# Patient Record
Sex: Female | Born: 1985 | Race: White | Hispanic: No | Smoking: Current some day smoker
Health system: Southern US, Community
[De-identification: ages and names within clinical notes are randomized; demographics above are authoritative.]

## PROBLEM LIST (undated history)

## (undated) ENCOUNTER — Inpatient Hospital Stay: Payer: Self-pay

## (undated) ENCOUNTER — Inpatient Hospital Stay: Payer: Self-pay | Admitting: Obstetrics and Gynecology

## (undated) DIAGNOSIS — N83209 Unspecified ovarian cyst, unspecified side: Secondary | ICD-10-CM

## (undated) DIAGNOSIS — Z8751 Personal history of pre-term labor: Secondary | ICD-10-CM

## (undated) DIAGNOSIS — Z9289 Personal history of other medical treatment: Secondary | ICD-10-CM

## (undated) DIAGNOSIS — R160 Hepatomegaly, not elsewhere classified: Secondary | ICD-10-CM

## (undated) HISTORY — PX: CHOLECYSTECTOMY: SHX55

## (undated) HISTORY — PX: APPENDECTOMY: SHX54

## (undated) HISTORY — PX: NEPHRECTOMY: SHX65

---

## 2015-03-21 NOTE — L&D Delivery Note (Signed)
Obstetrical Delivery Note   Date of Delivery:   03/07/2016 Primary OB:   [facility] Gestational Age/EDD: [id number] (Dated by LMP) Antepartum complications: late entry to care, insufficient prenatal care, THC use during pregnancy  Delivered By:   Farrel ConnersGUTIERREZ, Marissa Weaver, CNM  Delivery Type:   spontaneous vaginal delivery  Procedure Details:   Called to see patient who was feeling pelvic pressure and was 9 cm. Dilated. AROM for moderte meconium stained amniotic fluid. Patient prepared for delivery and in one contraction delivered a vigorous female infant in OA with loose nuchal cord x 2, reduced on perineum. Baby dried and placed on mother's abdomen. After delayed cord clamping the cord was cut by a family friend and baby was taken to warmer and weighed per patient request. Placenta delivered intact spontaneously with 3 vessel cord. No lacerations requiring repair were seen.  Anesthesia:    epidural Intrapartum complications: Moderate meconium stained amniotic fluid GBS:    unknown Laceration:    none Episiotomy:    none Placenta:    Via active 3rd stage. To pathology: no Estimated Blood Loss:  * No surgery found *  Baby:    Liveborn female, Apgars 8/9, weight 6#2oz/ Margarita GrizzleBrantley    Almond Fitzgibbon, CNM

## 2015-07-28 ENCOUNTER — Emergency Department
Admission: EM | Admit: 2015-07-28 | Discharge: 2015-07-28 | Disposition: A | Discharge status: Home / Self Care | Payer: Self-pay | Attending: Emergency Medicine | Admitting: Emergency Medicine

## 2015-07-28 ENCOUNTER — Emergency Department: Payer: Self-pay

## 2015-07-28 ENCOUNTER — Encounter: Payer: Self-pay | Admitting: *Deleted

## 2015-07-28 DIAGNOSIS — Z3201 Encounter for pregnancy test, result positive: Secondary | ICD-10-CM | POA: Insufficient documentation

## 2015-07-28 DIAGNOSIS — O2341 Unspecified infection of urinary tract in pregnancy, first trimester: Secondary | ICD-10-CM | POA: Insufficient documentation

## 2015-07-28 DIAGNOSIS — Z3491 Encounter for supervision of normal pregnancy, unspecified, first trimester: Secondary | ICD-10-CM

## 2015-07-28 DIAGNOSIS — R109 Unspecified abdominal pain: Secondary | ICD-10-CM

## 2015-07-28 DIAGNOSIS — F17200 Nicotine dependence, unspecified, uncomplicated: Secondary | ICD-10-CM | POA: Insufficient documentation

## 2015-07-28 DIAGNOSIS — N390 Urinary tract infection, site not specified: Secondary | ICD-10-CM

## 2015-07-28 DIAGNOSIS — Z3A09 9 weeks gestation of pregnancy: Secondary | ICD-10-CM | POA: Insufficient documentation

## 2015-07-28 DIAGNOSIS — O26899 Other specified pregnancy related conditions, unspecified trimester: Secondary | ICD-10-CM

## 2015-07-28 DIAGNOSIS — A5901 Trichomonal vulvovaginitis: Secondary | ICD-10-CM | POA: Insufficient documentation

## 2015-07-28 DIAGNOSIS — O98311 Other infections with a predominantly sexual mode of transmission complicating pregnancy, first trimester: Secondary | ICD-10-CM | POA: Insufficient documentation

## 2015-07-28 LAB — URINALYSIS COMPLETE WITH MICROSCOPIC (ARMC ONLY)
BILIRUBIN URINE: NEGATIVE
GLUCOSE, UA: NEGATIVE mg/dL
HGB URINE DIPSTICK: NEGATIVE
Ketones, ur: NEGATIVE mg/dL
NITRITE: NEGATIVE
Protein, ur: NEGATIVE mg/dL
SPECIFIC GRAVITY, URINE: 1.009 (ref 1.005–1.030)
pH: 6 (ref 5.0–8.0)

## 2015-07-28 LAB — CBC
HCT: 37.1 % (ref 35.0–47.0)
Hemoglobin: 12.8 g/dL (ref 12.0–16.0)
MCH: 31.7 pg (ref 26.0–34.0)
MCHC: 34.5 g/dL (ref 32.0–36.0)
MCV: 91.8 fL (ref 80.0–100.0)
PLATELETS: 224 10*3/uL (ref 150–440)
RBC: 4.04 MIL/uL (ref 3.80–5.20)
RDW: 12.5 % (ref 11.5–14.5)
WBC: 9.9 10*3/uL (ref 3.6–11.0)

## 2015-07-28 LAB — BASIC METABOLIC PANEL
Anion gap: 6 (ref 5–15)
BUN: 7 mg/dL (ref 6–20)
CALCIUM: 9.1 mg/dL (ref 8.9–10.3)
CHLORIDE: 106 mmol/L (ref 101–111)
CO2: 25 mmol/L (ref 22–32)
CREATININE: 0.75 mg/dL (ref 0.44–1.00)
GFR calc Af Amer: >60 mL/min (ref >60)
GFR calc non Af Amer: >60 mL/min (ref >60)
GLUCOSE: 89 mg/dL (ref 65–99)
Potassium: 3.2 mmol/L — ABNORMAL LOW (ref 3.5–5.1)
Sodium: 137 mmol/L (ref 135–145)

## 2015-07-28 LAB — CHLAMYDIA/NGC RT PCR (ARMC ONLY)
CHLAMYDIA TR: NOT DETECTED
N gonorrhoeae: NOT DETECTED

## 2015-07-28 LAB — HCG, QUANTITATIVE, PREGNANCY: hCG, Beta Chain, Quant, S: 132268 m[IU]/mL — ABNORMAL HIGH (ref <5)

## 2015-07-28 LAB — WET PREP, GENITAL
Sperm: NONE SEEN
Yeast Wet Prep HPF POC: NONE SEEN

## 2015-07-28 LAB — POCT PREGNANCY, URINE: Preg Test, Ur: POSITIVE — AB

## 2015-07-28 MED ORDER — NITROFURANTOIN MONOHYD MACRO 100 MG PO CAPS: 100.0000 mg | ORAL_CAPSULE | Freq: Two times a day (BID) | ORAL | Status: DC

## 2015-07-28 MED ORDER — OXYCODONE-ACETAMINOPHEN 5-325 MG PO TABS
2.0000 | ORAL_TABLET | Freq: Once | ORAL | Status: AC
  Administered 2015-07-28: 2 via ORAL

## 2015-07-28 MED ORDER — METRONIDAZOLE 500 MG PO TABS: 500.0000 mg | ORAL_TABLET | Freq: Two times a day (BID) | ORAL | Status: DC

## 2015-07-28 MED ORDER — OXYCODONE-ACETAMINOPHEN 5-325 MG PO TABS
ORAL_TABLET | ORAL | Status: AC
  Filled 2015-07-28: qty 2

## 2015-07-28 MED ORDER — ONDANSETRON HCL 4 MG/2ML IJ SOLN
4.0000 mg | Freq: Once | INTRAMUSCULAR | Status: AC
  Administered 2015-07-28: 4 mg via INTRAVENOUS

## 2015-07-28 MED ORDER — ONDANSETRON HCL 4 MG/2ML IJ SOLN
INTRAMUSCULAR | Status: AC
  Filled 2015-07-28: qty 2

## 2015-07-28 MED ORDER — OXYCODONE-ACETAMINOPHEN 5-325 MG PO TABS: 2.0000 | ORAL_TABLET | Freq: Once | ORAL | Status: DC

## 2015-07-28 MED ORDER — OXYCODONE-ACETAMINOPHEN 5-325 MG PO TABS
1.0000 | ORAL_TABLET | Freq: Once | ORAL | Status: AC
  Administered 2015-07-28: 1 via ORAL
  Filled 2015-07-28: qty 1

## 2015-07-28 NOTE — ED Notes (Signed)
Pt reports she has right side flank pain, left upper toothache.  Sx for 3 weeks.  Pt reports she has nausea.  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  Pt alert.

## 2015-07-28 NOTE — ED Provider Notes (Signed)
[facility] Provider [facility]        Time seen: ----------------------------------------- 4:30 PM on 07/28/2015 -----------------------------------------    I have reviewed the triage vital signs and the nursing notes.   HISTORY  Chief Complaint Flank Pain and Dental Pain    HPI Kim Kidd is a 30 y.o. female who presents ER with right side flank pain and left upper tooth ache for the last 3 weeks.Patient is unsure if she is pregnant, she has had some nausea. 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 She denies fevers or chills or other complaints.   No past medical history on file.  There are no active problems to display for this patient.   No past surgical history on file.  Allergies Review of patient's allergies indicates no known allergies.  Social History Social History  Substance Use Topics  . Smoking status: Current Some Day Smoker  . Smokeless tobacco: Not on file  . Alcohol Use: No    Review of Systems Constitutional: Negative for fever. Eyes: Negative for visual changes. ENT: Negative for sore throat. Cardiovascular: Negative for chest pain. Respiratory: Negative for shortness of breath. Gastrointestinal: Positive for abdominal pain, nausea Genitourinary: Negative for dysuria. Musculoskeletal: Positive for right-sided low back pain Skin: Negative for rash. Neurological: Negative for headaches, focal weakness or numbness.  10-point ROS otherwise negative.  ____________________________________________   PHYSICAL EXAM:  VITAL SIGNS: ED Triage Vitals  Enc Vitals Group     BP 07/28/15 1549 125/73 mmHg     Pulse Rate 07/28/15 1549 96     Resp 07/28/15 1549 20     Temp 07/28/15 1549 98.5 F (36.9 C)     Temp Source 07/28/15 1549 Oral     SpO2 07/28/15 1549 99 %     Weight 07/28/15 1549 170 lb (77.111 kg)     Height  07/28/15 1549 5\' 7"  (1.702 m)     Head Cir --      Peak Flow --      Pain Score 07/28/15 1550 7     Pain Loc --      Pain Edu? --      Excl. in GC? --     Constitutional: Alert and oriented. Mild distress Eyes: Conjunctivae are normal. PERRL. Normal extraocular movements. ENT   Head: Normocephalic and atraumatic.   Nose: No congestion/rhinnorhea.   Mouth/Throat: Mucous membranes are moist.   Neck: No stridor. Cardiovascular: Normal rate, regular rhythm. No murmurs, rubs, or gallops. Respiratory: Normal respiratory effort without tachypnea nor retractions. Breath sounds are clear and equal bilaterally. No wheezes/rales/rhonchi. Gastrointestinal: Right pelvic and flank tenderness, no rebound or guarding. Normal bowel sounds. Right CVA tenderness. Genitourinary: Copious yellowish discharge Musculoskeletal: Nontender with normal range of motion in all extremities. No lower extremity tenderness nor edema. Neurologic:  Normal speech and language. No gross focal neurologic deficits are appreciated.  Skin:  Skin is warm, dry and intact. No rash noted. Psychiatric: Mood and affect are normal. Speech and behavior are normal.   ____________________________________________  ED COURSE:  Pertinent labs & imaging results that were available during my care of the patient were reviewed by me and considered in my medical decision making (see chart for details). Patient presents with nonspecific right-sided abdominal pain. I'll check basic labs and reevaluate. ____________________________________________    LABS (pertinent positives/negatives)  Labs Reviewed  WET PREP, GENITAL - Abnormal; Notable  for the following:    Trich, Wet Prep PRESENT (*)    Clue Cells Wet Prep HPF POC PRESENT (*)    WBC, Wet Prep HPF POC FEW (*)    All other components within normal limits  BASIC METABOLIC PANEL - Abnormal; Notable for the following:    Potassium 3.2 (*)    All other components within  normal limits  URINALYSIS COMPLETEWITH MICROSCOPIC ([facility] ONLY) - Abnormal; Notable for the following:    Color, Urine YELLOW (*)    APPearance HAZY (*)    Leukocytes, UA 3+ (*)    Bacteria, UA RARE (*)    Squamous Epithelial / LPF 6-30 (*)    All other components within normal limits  HCG, QUANTITATIVE, PREGNANCY - Abnormal; Notable for the following:    hCG, Beta Chain, Quant, S [id number] (*)    All other components within normal limits  POCT PREGNANCY, URINE - Abnormal; Notable for the following:    Preg Test, Ur POSITIVE (*)    All other components within normal limits  CHLAMYDIA/NGC RT PCR ([facility] ONLY)  CBC  POC URINE PREG, ED    RADIOLOGY  Pelvic ultrasound, Renal ultrasound  IMPRESSION: Single live intrauterine gestation with estimated gestational age [redacted] weeks 0 days by crown-rump length. IMPRESSION: No hydronephrosis.  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  Given the small size, consider follow-up imaging after pregnancy if clinically warranted. Renal ultrasound could be performed to document stability, or CT/MRI abdomen with/without contrast could be performed to attempt definitive characterization. ____________________________________________  FINAL ASSESSMENT AND PLAN  Abdominal pain and pregnancy, Trichomonas, UTI  Plan: Patient with labs and imaging as dictated above. Patient presents with a newly diagnosed pregnancy of 9 weeks. She also has UTI and Trichomonas. She'll be discharged with Macrobid and Flagyl. She is referred to OB/GYN for outpatient follow-up.   Emily Filbert, MD   Note: This dictation was prepared with Dragon dictation. Any transcriptional errors that result from this process are unintentional   Emily Filbert, MD 07/28/15 [phone]

## 2015-07-28 NOTE — Discharge Instructions (Signed)
First Trimester of Pregnancy °The first trimester of pregnancy is from week 1 until the end of week 12 (months 1 through 3). During this time, your baby will begin to develop inside you. At 6-8 weeks, the eyes and face are formed, and the heartbeat can be seen on ultrasound. At the end of 12 weeks, all the baby's organs are formed. Prenatal care is all the medical care you receive before the birth of your baby. Make sure you get good prenatal care and follow all of your doctor's instructions. °HOME CARE  °Medicines °· Take medicine only as told by your doctor. Some medicines are safe and some are not during pregnancy. °· Take your prenatal vitamins as told by your doctor. °· Take medicine that helps you poop (stool softener) as needed if your doctor says it is okay. °Diet °· Eat regular, healthy meals. °· Your doctor will tell you the amount of weight gain that is right for you. °· Avoid raw meat and uncooked cheese. °· If you feel sick to your stomach (nauseous) or throw up (vomit): °· Eat 4 or 5 small meals a day instead of 3 large meals. °· Try eating a few soda crackers. °· Drink liquids between meals instead of during meals. °· If you have a hard time pooping (constipation): °· Eat high-fiber foods like fresh vegetables, fruit, and whole grains. °· Drink enough fluids to keep your pee (urine) clear or pale yellow. °Activity and Exercise °· Exercise only as told by your doctor. Stop exercising if you have cramps or pain in your lower belly (abdomen) or low back. °· Try to avoid standing for long periods of time. Move your legs often if you must stand in one place for a long time. °· Avoid heavy lifting. °· Wear low-heeled shoes. Sit and stand up straight. °· You can have sex unless your doctor tells you not to. °Relief of Pain or Discomfort °· Wear a good support bra if your breasts are sore. °· Take warm water baths (sitz baths) to soothe pain or discomfort caused by hemorrhoids. Use hemorrhoid cream if your  doctor says it is okay. °· Rest with your legs raised if you have leg cramps or low back pain. °· Wear support hose if you have puffy, bulging veins (varicose veins) in your legs. Raise (elevate) your feet for 15 minutes, 3-4 times a day. Limit salt in your diet. °Prenatal Care °· Schedule your prenatal visits by the twelfth week of pregnancy. °· Write down your questions. Take them to your prenatal visits. °· Keep all your prenatal visits as told by your doctor. °Safety °· Wear your seat belt at all times when driving. °· Make a list of emergency phone numbers. The list should include numbers for family, friends, the hospital, and police and fire departments. °General Tips °· Ask your doctor for a referral to a local prenatal class. Begin classes no later than at the start of month 6 of your pregnancy. °· Ask for help if you need counseling or help with nutrition. Your doctor can give you advice or tell you where to go for help. °· Do not use hot tubs, steam rooms, or saunas. °· Do not douche or use tampons or scented sanitary pads. °· Do not cross your legs for long periods of time. °· Avoid litter boxes and soil used by cats. °· Avoid all smoking, herbs, and alcohol. Avoid drugs not approved by your doctor. °· Do not use any tobacco products, including cigarettes,   chewing tobacco, and electronic cigarettes. If you need help quitting, ask your doctor. You may get counseling or other support to help you quit.  Visit your dentist. At home, brush your teeth with a soft toothbrush. Be gentle when you floss. GET HELP IF:  You are dizzy.  You have mild cramps or pressure in your lower belly.  You have a nagging pain in your belly area.  You continue to feel sick to your stomach, throw up, or have watery poop (diarrhea).  You have a bad smelling fluid coming from your vagina.  You have pain with peeing (urination).  You have increased puffiness (swelling) in your face, hands, legs, or ankles. GET HELP  RIGHT AWAY IF:   You have a fever.  You are leaking fluid from your vagina.  You have spotting or bleeding from your vagina.  You have very bad belly cramping or pain.  You gain or lose weight rapidly.  You throw up blood. It may look like coffee grounds.  You are around people who have [place] measles, fifth disease, or chickenpox.  You have a very bad headache.  You have shortness of breath.  You have any kind of trauma, such as from a fall or a car accident.   This information is not intended to replace advice given to you by your health care provider. Make sure you discuss any questions you have with your health care provider.   Document Released: 08/23/2007 Document Revised: 03/27/2014 Document Reviewed: 01/14/2013 Elsevier Interactive Patient Education 2016 Elsevier Inc.  Pregnancy and Urinary Tract Infection A urinary tract infection (UTI) is a bacterial infection of the urinary tract. Infection of the urinary tract can include the ureters, kidneys (pyelonephritis), bladder (cystitis), and urethra (urethritis). All pregnant women should be screened for bacteria in the urinary tract. Identifying and treating a UTI will decrease the risk of preterm labor and developing more serious infections in both the mother and baby. CAUSES Bacteria germs cause almost all UTIs.  RISK FACTORS Many factors can increase your chances of getting a UTI during pregnancy. These include:  Having a short urethra.  Poor toilet and hygiene habits.  Sexual intercourse.  Blockage of urine along the urinary tract.  Problems with the pelvic muscles or nerves.  Diabetes.  Obesity.  Bladder problems after having several children.  Previous history of UTI. SIGNS AND SYMPTOMS   Pain, burning, or a stinging feeling when urinating.  Suddenly feeling the need to urinate right away (urgency).  Loss of bladder control (urinary incontinence).  Frequent urination, more than is common with  pregnancy.  Lower abdominal or back discomfort.  Cloudy urine.  Blood in the urine (hematuria).  Fever. When the kidneys are infected, the symptoms may be:  Back pain.  Flank pain on the right side more so than the left.  Fever.  Chills.  Nausea.  Vomiting. DIAGNOSIS  A urinary tract infection is usually diagnosed through urine tests. Additional tests and procedures are sometimes done. These may include:  Ultrasound exam of the kidneys, ureters, bladder, and urethra.  Looking in the bladder with a lighted tube (cystoscopy). TREATMENT Typically, UTIs can be treated with antibiotic medicines.  HOME CARE INSTRUCTIONS   Only take over-the-counter or prescription medicines as directed by your health care provider. If you were prescribed antibiotics, take them as directed. Finish them even if you start to feel better.  Drink enough fluids to keep your urine clear or pale yellow.  Do not have sexual intercourse until  the infection is gone and your health care provider says it is okay.  Make sure you are tested for UTIs throughout your pregnancy. These infections often come back. Preventing a UTI in the Future  Practice good toilet habits. Always wipe from front to back. Use the tissue only once.  Do not hold your urine. Empty your bladder as soon as possible when the urge comes.  Do not douche or use deodorant sprays.  Wash with soap and warm water around the genital area and the anus.  Empty your bladder before and after sexual intercourse.  Wear underwear with a cotton crotch.  Avoid caffeine and carbonated drinks. They can irritate the bladder.  Drink cranberry juice or take cranberry pills. This may decrease the risk of getting a UTI.  Do not drink alcohol.  Keep all your appointments and tests as scheduled. SEEK MEDICAL CARE IF:   Your symptoms get worse.  You are still having fevers 2 or more days after treatment begins.  You have a rash.  You  feel that you are having problems with medicines prescribed.  You have abnormal vaginal discharge. SEEK IMMEDIATE MEDICAL CARE IF:   You have back or flank pain.  You have chills.  You have blood in your urine.  You have nausea and vomiting.  You have contractions of your uterus.  You have a gush of fluid from the vagina. MAKE SURE YOU:  Understand these instructions.   Will watch your condition.   Will get help right away if you are not doing well or get worse.    This information is not intended to replace advice given to you by your health care provider. Make sure you discuss any questions you have with your health care provider.   Document Released: 07/01/2010 Document Revised: 12/25/2012 Document Reviewed: 10/03/2012 Elsevier Interactive Patient Education 2016 [employer]  Trichomoniasis Trichomoniasis is an infection caused by an organism called Trichomonas. The infection can affect both women and men. In women, the outer female genitalia and the vagina are affected. In men, the penis is mainly affected, but the prostate and other reproductive organs can also be involved. Trichomoniasis is a sexually transmitted infection (STI) and is most often passed to another person through sexual contact.  RISK FACTORS  Having unprotected sexual intercourse.  Having sexual intercourse with an infected partner. SIGNS AND SYMPTOMS  Symptoms of trichomoniasis in women include:  Abnormal gray-green frothy vaginal discharge.  Itching and irritation of the vagina.  Itching and irritation of the area outside the vagina. Symptoms of trichomoniasis in men include:   Penile discharge with or without pain.  Pain during urination. This results from inflammation of the urethra. DIAGNOSIS  Trichomoniasis may be found during a Pap test or physical exam. Your health care provider may use one of the following methods to help diagnose this infection:  Testing the pH of the vagina  with a test tape.  Using a vaginal swab test that checks for the Trichomonas organism. A test is available that provides results within a few minutes.  Examining a urine sample.  Testing vaginal secretions. Your health care provider may test you for other STIs, including HIV. TREATMENT   You may be given medicine to fight the infection. Women should inform their health care provider if they could be or are pregnant. Some medicines used to treat the infection should not be taken during pregnancy.  Your health care provider may recommend over-the-counter medicines or creams to decrease itching or  irritation.  Your sexual partner will need to be treated if infected.  Your health care provider may test you for infection again 3 months after treatment. HOME CARE INSTRUCTIONS   Take medicines only as directed by your health care provider.  Take over-the-counter medicine for itching or irritation as directed by your health care provider.  Do not have sexual intercourse while you have the infection.  Women should not douche or wear tampons while they have the infection.  Discuss your infection with your partner. Your partner may have gotten the infection from you, or you may have gotten it from your partner.  Have your sex partner get examined and treated if necessary.  Practice safe, informed, and protected sex.  See your health care provider for other STI testing. SEEK MEDICAL CARE IF:   You still have symptoms after you finish your medicine.  You develop abdominal pain.  You have pain when you urinate.  You have bleeding after sexual intercourse.  You develop a rash.  Your medicine makes you sick or makes you throw up (vomit). MAKE SURE YOU:  Understand these instructions.  Will watch your condition.  Will get help right away if you are not doing well or get worse.   This information is not intended to replace advice given to you by your health care provider. Make  sure you discuss any questions you have with your health care provider.   Document Released: 08/30/2000 Document Revised: 03/27/2014 Document Reviewed: 12/16/2012 Elsevier Interactive Patient Education [employer]

## 2015-09-16 ENCOUNTER — Emergency Department
Admission: EM | Admit: 2015-09-16 | Discharge: 2015-09-16 | Disposition: A | Discharge status: Home / Self Care | Payer: Self-pay | Attending: Emergency Medicine | Admitting: Emergency Medicine

## 2015-09-16 ENCOUNTER — Encounter: Payer: Self-pay | Admitting: Emergency Medicine

## 2015-09-16 DIAGNOSIS — O23511 Infections of cervix in pregnancy, first trimester: Secondary | ICD-10-CM | POA: Insufficient documentation

## 2015-09-16 DIAGNOSIS — O99331 Smoking (tobacco) complicating pregnancy, first trimester: Secondary | ICD-10-CM | POA: Insufficient documentation

## 2015-09-16 DIAGNOSIS — N390 Urinary tract infection, site not specified: Secondary | ICD-10-CM

## 2015-09-16 DIAGNOSIS — O26891 Other specified pregnancy related conditions, first trimester: Secondary | ICD-10-CM | POA: Insufficient documentation

## 2015-09-16 DIAGNOSIS — R109 Unspecified abdominal pain: Secondary | ICD-10-CM

## 2015-09-16 DIAGNOSIS — A599 Trichomoniasis, unspecified: Secondary | ICD-10-CM | POA: Insufficient documentation

## 2015-09-16 DIAGNOSIS — N72 Inflammatory disease of cervix uteri: Secondary | ICD-10-CM

## 2015-09-16 DIAGNOSIS — Z3A13 13 weeks gestation of pregnancy: Secondary | ICD-10-CM | POA: Insufficient documentation

## 2015-09-16 DIAGNOSIS — O26899 Other specified pregnancy related conditions, unspecified trimester: Secondary | ICD-10-CM

## 2015-09-16 DIAGNOSIS — M5431 Sciatica, right side: Secondary | ICD-10-CM

## 2015-09-16 DIAGNOSIS — R1032 Left lower quadrant pain: Secondary | ICD-10-CM | POA: Insufficient documentation

## 2015-09-16 DIAGNOSIS — F17210 Nicotine dependence, cigarettes, uncomplicated: Secondary | ICD-10-CM | POA: Insufficient documentation

## 2015-09-16 DIAGNOSIS — O2341 Unspecified infection of urinary tract in pregnancy, first trimester: Secondary | ICD-10-CM | POA: Insufficient documentation

## 2015-09-16 DIAGNOSIS — M5441 Lumbago with sciatica, right side: Secondary | ICD-10-CM | POA: Insufficient documentation

## 2015-09-16 LAB — CBC
HCT: 36 % (ref 35.0–47.0)
Hemoglobin: 12.7 g/dL (ref 12.0–16.0)
MCH: 32.5 pg (ref 26.0–34.0)
MCHC: 35.3 g/dL (ref 32.0–36.0)
MCV: 91.9 fL (ref 80.0–100.0)
PLATELETS: 252 10*3/uL (ref 150–440)
RBC: 3.92 MIL/uL (ref 3.80–5.20)
RDW: 12.7 % (ref 11.5–14.5)
WBC: 10.9 10*3/uL (ref 3.6–11.0)

## 2015-09-16 LAB — WET PREP, GENITAL
Clue Cells Wet Prep HPF POC: NONE SEEN
Sperm: NONE SEEN
Yeast Wet Prep HPF POC: NONE SEEN

## 2015-09-16 LAB — CHLAMYDIA/NGC RT PCR (ARMC ONLY)
Chlamydia Tr: NOT DETECTED
N GONORRHOEAE: NOT DETECTED

## 2015-09-16 LAB — URINALYSIS COMPLETE WITH MICROSCOPIC (ARMC ONLY)
BILIRUBIN URINE: NEGATIVE
GLUCOSE, UA: NEGATIVE mg/dL
Hgb urine dipstick: NEGATIVE
NITRITE: NEGATIVE
Protein, ur: NEGATIVE mg/dL
SPECIFIC GRAVITY, URINE: 1.026 (ref 1.005–1.030)
pH: 5 (ref 5.0–8.0)

## 2015-09-16 LAB — COMPREHENSIVE METABOLIC PANEL
ALT: 9 U/L — AB (ref 14–54)
AST: 12 U/L — AB (ref 15–41)
Albumin: 3.6 g/dL (ref 3.5–5.0)
Alkaline Phosphatase: 47 U/L (ref 38–126)
Anion gap: 8 (ref 5–15)
BILIRUBIN TOTAL: 0.4 mg/dL (ref 0.3–1.2)
BUN: 8 mg/dL (ref 6–20)
CALCIUM: 8.9 mg/dL (ref 8.9–10.3)
CO2: 24 mmol/L (ref 22–32)
CREATININE: 0.62 mg/dL (ref 0.44–1.00)
Chloride: 102 mmol/L (ref 101–111)
Glucose, Bld: 111 mg/dL — ABNORMAL HIGH (ref 65–99)
Potassium: 3.2 mmol/L — ABNORMAL LOW (ref 3.5–5.1)
Sodium: 134 mmol/L — ABNORMAL LOW (ref 135–145)
TOTAL PROTEIN: 7.5 g/dL (ref 6.5–8.1)

## 2015-09-16 LAB — LIPASE, BLOOD: Lipase: 25 U/L (ref 11–51)

## 2015-09-16 LAB — POCT PREGNANCY, URINE: PREG TEST UR: POSITIVE — AB

## 2015-09-16 MED ORDER — AZITHROMYCIN 1 G PO PACK
PACK | ORAL | Status: AC
  Administered 2015-09-16: 1 g via ORAL
  Filled 2015-09-16: qty 1

## 2015-09-16 MED ORDER — CEFTRIAXONE SODIUM 250 MG IJ SOLR
250.0000 mg | Freq: Once | INTRAMUSCULAR | Status: AC
  Administered 2015-09-16: 250 mg via INTRAMUSCULAR

## 2015-09-16 MED ORDER — ACETAMINOPHEN 325 MG PO TABS
650.0000 mg | ORAL_TABLET | Freq: Once | ORAL | Status: AC
  Administered 2015-09-16: 650 mg via ORAL

## 2015-09-16 MED ORDER — METRONIDAZOLE 500 MG PO TABS
500.0000 mg | ORAL_TABLET | Freq: Once | ORAL | Status: AC
  Administered 2015-09-16: 500 mg via ORAL

## 2015-09-16 MED ORDER — CEPHALEXIN 500 MG PO CAPS
500.0000 mg | ORAL_CAPSULE | Freq: Once | ORAL | Status: AC
  Administered 2015-09-16: 500 mg via ORAL
  Filled 2015-09-16 (×2): qty 1

## 2015-09-16 MED ORDER — METOCLOPRAMIDE HCL 10 MG PO TABS
10.0000 mg | ORAL_TABLET | Freq: Once | ORAL | Status: AC
  Administered 2015-09-16: 10 mg via ORAL
  Filled 2015-09-16 (×2): qty 1

## 2015-09-16 MED ORDER — CEFTRIAXONE SODIUM 250 MG IJ SOLR
INTRAMUSCULAR | Status: AC
  Administered 2015-09-16: 250 mg via INTRAMUSCULAR
  Filled 2015-09-16: qty 250

## 2015-09-16 MED ORDER — LIDOCAINE HCL (PF) 1 % IJ SOLN
INTRAMUSCULAR | Status: AC
  Filled 2015-09-16: qty 5

## 2015-09-16 MED ORDER — METOCLOPRAMIDE HCL 10 MG PO TABS: 10.0000 mg | ORAL_TABLET | Freq: Four times a day (QID) | ORAL | Status: DC | PRN

## 2015-09-16 MED ORDER — METRONIDAZOLE 500 MG PO TABS: 500.0000 mg | ORAL_TABLET | Freq: Two times a day (BID) | ORAL | Status: AC

## 2015-09-16 MED ORDER — AZITHROMYCIN 1 G PO PACK
1.0000 g | PACK | Freq: Once | ORAL | Status: AC
  Administered 2015-09-16: 1 g via ORAL

## 2015-09-16 MED ORDER — CEPHALEXIN 500 MG PO CAPS: 500.0000 mg | ORAL_CAPSULE | Freq: Three times a day (TID) | ORAL | Status: AC

## 2015-09-16 MED ORDER — ACETAMINOPHEN 325 MG PO TABS
ORAL_TABLET | ORAL | Status: AC
  Administered 2015-09-16: 650 mg via ORAL
  Filled 2015-09-16: qty 2

## 2015-09-16 MED ORDER — METRONIDAZOLE 500 MG PO TABS
ORAL_TABLET | ORAL | Status: AC
  Administered 2015-09-16: 500 mg via ORAL
  Filled 2015-09-16: qty 1

## 2015-09-16 NOTE — ED Notes (Signed)
Pt in via triage with complaints of LUQ abdominal pain with nausea/vomiting x 2 days.  Pt also with complaints of right lower back pain radiating down into leg x 2 weeks.  Pt A/Ox4, no immediate distress at this time.

## 2015-09-16 NOTE — ED Provider Notes (Addendum)
[facility] Emergency Department Provider Note   ____________________________________________  Time seen: Approximately 8:10 PM  I have reviewed the triage vital signs and the nursing notes.   HISTORY  Chief Complaint Back Pain   HPI Kim Kidd is a 30 y.o. female with a history of a cholecystectomy as well as an appendectomy was presented to the emergency department today at [redacted] weeks pregnant. She is taking prenatal vitamins at home but has not established prenatal care area. She had an ultrasound with intrauterine pregnancy done in May here. She is here complaining of right-sided back pain. It is the low lumbar region radiating to her buttock. She says it is sharp and a 6 out of 10. She says that she is also vomiting over the past 3 days and having some left sided abdominal cramping. She says that she is also having a clear, sticky vaginal discharge. She says that she was recently treated for Trichomonas when she was here in May.Denies any loss of bowel or bladder continence.   History reviewed. No pertinent past medical history.  There are no active problems to display for this patient.   Past Surgical History  Procedure Laterality Date  . Nephrectomy    . Cholecystectomy    . Appendectomy      Current Outpatient Rx  Name  Route  Sig  Dispense  Refill  . metroNIDAZOLE (FLAGYL) 500 MG tablet   Oral   Take 1 tablet (500 mg total) by mouth 2 (two) times daily.   14 tablet   0   . nitrofurantoin, macrocrystal-monohydrate, (MACROBID) 100 MG capsule   Oral   Take 1 capsule (100 mg total) by mouth 2 (two) times daily.   20 capsule   0     Allergies Review of patient's allergies indicates no known allergies.  No family history on file.  Social History Social History  Substance Use Topics  . Smoking status: Current Some Day Smoker -- 0.50 packs/day    Types: Cigarettes  . Smokeless tobacco: None  . Alcohol Use: No    Review of  Systems Constitutional: No fever/chills Eyes: No visual changes. ENT: No sore throat. Cardiovascular: Denies chest pain. Respiratory: Denies shortness of breath. Gastrointestinal:  No diarrhea.  No constipation. Genitourinary: Negative for dysuria. Musculoskeletal: As above Skin: Negative for rash. Neurological: Negative for headaches, focal weakness or numbness.  10-point ROS otherwise negative.  ____________________________________________   PHYSICAL EXAM:  VITAL SIGNS: ED Triage Vitals  Enc Vitals Group     BP 09/16/15 1833 126/70 mmHg     Pulse Rate 09/16/15 1833 101     Resp 09/16/15 1833 16     Temp 09/16/15 1833 98.6 F (37 C)     Temp Source 09/16/15 1833 Oral     SpO2 09/16/15 1833 100 %     Weight 09/16/15 1833 180 lb (81.647 kg)     Height 09/16/15 1833  (1.702 m)     Head Cir --      Peak Flow --      Pain Score 09/16/15 1834 7     Pain Loc --      Pain Edu? --      Excl. in GC? --     Constitutional: Alert and oriented. Well appearing and in no acute distress. Eyes: Conjunctivae are normal. PERRL. EOMI. Head: Atraumatic. Nose: No congestion/rhinnorhea. Mouth/Throat: Mucous membranes are moist.   Neck: No stridor.   Cardiovascular: Normal rate, regular rhythm. Grossly normal  heart sounds.   Respiratory: Normal respiratory effort.  No retractions. Lungs CTAB. Gastrointestinal: Soft With gravid uterus about 2 cm above the pelvic brim. Tender to palpation suprapubic as well as to the left lower quadrant. Tenderness is mild. No distention.  No CVA tenderness. Genitourinary: Normal external exam. Speculum exam with a yellow discharge. Bimanual exam with a closed cervical os. There is no CMT. No uterine or adnexal tenderness nor masses. Musculoskeletal: No lower extremity tenderness nor edema.  No joint effusions. No tenderness to low back. Negative straight leg raises bilaterally. Neurologic:  Normal speech and language. No gross focal neurologic deficits  are appreciated.  Skin:  Skin is warm, dry and intact. No rash noted. Psychiatric: Mood and affect are normal. Speech and behavior are normal.  ____________________________________________   LABS (all labs ordered are listed, but only abnormal results are displayed)  Labs Reviewed  WET PREP, GENITAL - Abnormal; Notable for the following:    Trich, Wet Prep PRESENT (*)    WBC, Wet Prep HPF POC MANY (*)    All other components within normal limits  COMPREHENSIVE METABOLIC PANEL - Abnormal; Notable for the following:    Sodium 134 (*)    Potassium 3.2 (*)    Glucose, Bld 111 (*)    AST 12 (*)    ALT 9 (*)    All other components within normal limits  URINALYSIS COMPLETEWITH MICROSCOPIC ([facility] ONLY) - Abnormal; Notable for the following:    Color, Urine YELLOW (*)    APPearance HAZY (*)    Ketones, ur TRACE (*)    Leukocytes, UA 2+ (*)    Bacteria, UA FEW (*)    Squamous Epithelial / LPF 0-5 (*)    All other components within normal limits  POCT PREGNANCY, URINE - Abnormal; Notable for the following:    Preg Test, Ur POSITIVE (*)    All other components within normal limits  CHLAMYDIA/NGC RT PCR ([facility] ONLY)  LIPASE, BLOOD  CBC  POC URINE PREG, ED   ____________________________________________  EKG   ____________________________________________  RADIOLOGY   ____________________________________________   PROCEDURES    ____________________________________________   INITIAL IMPRESSION / ASSESSMENT AND PLAN / ED COURSE  Pertinent labs & imaging results that were available during my care of the patient were reviewed by me and considered in my medical decision making (see chart for details).  Fetal heart tones measured at 153 bpm.  ----------------------------------------- 9:22 PM on 09/16/2015 -----------------------------------------  Patient was positive for Trichomonas. She says that she has been sexually active with the same partner since being  diagnosed and treated the last time she is unsure if this partner has also been treated although she did tell him that he would need to be. Very reassuring pelvic exam as far as PID.  We'll treat for cervicitis as well as Trichomonas. We'll also treat for UTI. Feel that the right-sided back and buttock pain is likely sciatic pain which is likely worsening during pregnancy. I told patient that she must not take ibuprofen nor Aleve or any other product that is not Tylenol. She may continue to use her heating pad. She will continue to take her prenatal vitamins. She knows not to have any sexual activity until she has been treated and also tested and cleared by another health care provider for other sexually transmitted diseases such as HIV, syphilis and herpes. She'll be following up with OB/GYN for these tests. She is also aware that she missed her partner that she was diagnosed  again with Trichomonas and that he must be treated and also not engage in any sexual activity until he is treated and then cleared after further testing for other STDs such as HIV, syphilis and herpes. She is understanding of the plan and willing to comply. ____________________________________________   FINAL CLINICAL IMPRESSION(S) / ED DIAGNOSES  Sciatica. Abdominal pain and pregnancy. Cervicitis. Trichomonas. UTI.    NEW MEDICATIONS STARTED DURING THIS VISIT:  New Prescriptions   No medications on file     Note:  This document was prepared using Dragon voice recognition software and may include unintentional dictation errors.    Myrna Blazeravid Matthew Astaria Nanez, MD 09/16/15 2125  Also, patient able to tolerate by mouth in the emergency Department after Reglan. No episodes of vomiting in the emergency department.  Myrna Blazeravid Matthew Oniel Meleski, MD 09/16/15 2135

## 2015-09-16 NOTE — ED Notes (Signed)
C/O abdominal pain and low back pain x 2 weeks.  Patient is [redacted] weeks pregnant, has not had any prenatal care.

## 2015-09-18 LAB — URINE CULTURE

## 2015-12-10 ENCOUNTER — Other Ambulatory Visit: Payer: Self-pay | Admitting: Advanced Practice Midwife

## 2015-12-23 ENCOUNTER — Ambulatory Visit: Admission: RE | Admit: 2015-12-23 | Payer: Self-pay | Source: Ambulatory Visit

## 2016-01-21 ENCOUNTER — Encounter: Payer: Self-pay | Admitting: *Deleted

## 2016-01-21 ENCOUNTER — Observation Stay
Admission: EM | Admit: 2016-01-21 | Discharge: 2016-01-21 | Disposition: A | Discharge status: Home / Self Care | Payer: Medicaid Other | Attending: Obstetrics & Gynecology | Admitting: Obstetrics & Gynecology

## 2016-01-21 DIAGNOSIS — R1030 Lower abdominal pain, unspecified: Secondary | ICD-10-CM | POA: Diagnosis not present

## 2016-01-21 DIAGNOSIS — R197 Diarrhea, unspecified: Secondary | ICD-10-CM | POA: Diagnosis not present

## 2016-01-21 DIAGNOSIS — O26893 Other specified pregnancy related conditions, third trimester: Principal | ICD-10-CM | POA: Insufficient documentation

## 2016-01-21 DIAGNOSIS — Z3A34 34 weeks gestation of pregnancy: Secondary | ICD-10-CM | POA: Insufficient documentation

## 2016-01-21 DIAGNOSIS — Z9049 Acquired absence of other specified parts of digestive tract: Secondary | ICD-10-CM | POA: Insufficient documentation

## 2016-01-21 DIAGNOSIS — O99613 Diseases of the digestive system complicating pregnancy, third trimester: Secondary | ICD-10-CM | POA: Insufficient documentation

## 2016-01-21 DIAGNOSIS — O26899 Other specified pregnancy related conditions, unspecified trimester: Secondary | ICD-10-CM | POA: Diagnosis present

## 2016-01-21 DIAGNOSIS — R109 Unspecified abdominal pain: Secondary | ICD-10-CM

## 2016-01-21 MED ORDER — ACETAMINOPHEN 325 MG PO TABS: 650.0000 mg | ORAL_TABLET | ORAL | Status: DC | PRN

## 2016-01-21 MED ORDER — ONDANSETRON HCL 4 MG/2ML IJ SOLN: 4.0000 mg | Freq: Four times a day (QID) | INTRAMUSCULAR | Status: DC | PRN

## 2016-01-21 MED ORDER — HYDROCODONE-ACETAMINOPHEN 5-325 MG PO TABS
1.0000 | ORAL_TABLET | Freq: Once | ORAL | Status: AC
  Administered 2016-01-21: 1 via ORAL

## 2016-01-21 NOTE — Discharge Instructions (Signed)
Discharge instructions and labor precautions reviewed with patient; patient verbalized understanding.  Pt. Signed copy of instructions and copy given.  Pt. Encouraged  to continue with scheduled OB appointment.

## 2016-01-21 NOTE — H&P (Signed)
Obstetrics Admission History & Physical   CC: lower abdominal pain and diarrhea, pregnancy   HPI:  30 y.o. [id number] @ 7272w0d (03/03/2016, by Other Basis). Admitted on 01/21/2016:   Patient Active Problem List   Diagnosis Date Noted  . Abdominal pain affecting pregnancy 01/21/2016  . Diarrhea 01/21/2016     Presents for lower abdominal pain for 3 days, also diarrhea for 3 days (3 episodes today).  Has been on Immodium.  Good FM.  No VB or ROM.  No radiation.  No alleviators.  No other context.  [facility]  PMHx: History reviewed. No pertinent past medical history. PSHx:  Past Surgical History:  Procedure Laterality Date  . APPENDECTOMY    . CHOLECYSTECTOMY    . NEPHRECTOMY     Medications:  Prescriptions Prior to Admission  Medication Sig Dispense Refill Last Dose  . metoCLOPramide (REGLAN) 10 MG tablet Take 1 tablet (10 mg total) by mouth every 6 (six) hours as needed. (Patient not taking: Reported on 01/21/2016) 12 tablet 0 Not Taking at Unknown time  . nitrofurantoin, macrocrystal-monohydrate, (MACROBID) 100 MG capsule Take 1 capsule (100 mg total) by mouth 2 (two) times daily. (Patient not taking: Reported on 01/21/2016) 20 capsule 0 Completed Course at Unknown time   Allergies: has No Known Allergies. OBHx:  OB History  Gravida Para Term Preterm AB Living  6 5 3 2   4   SAB TAB Ectopic Multiple Live Births          4    # Outcome Date GA Lbr Len/2nd Weight Sex Delivery Anes PTL Lv  6 Current           5 Preterm 10/05/07    M Vag-Spont   LIV  4 Preterm 05/19/06    M Vag-Spont   FD     Complications: Abruptio Placenta  3 Term 10/07/05    M Vag-Spont   LIV  2 Term 05/23/04    F Vag-Spont   LIV  1 Term 10/02/02    M Vag-Spont   LIV     AVW:[id number]/[id number]:Negative/unremarkable except as detailed in HPI.  No GYN cancers. Soc Hx: Alcohol: none and Recreational drug use: none  Objective:   Vitals:   01/21/16 1303  BP: 118/68  Pulse: 85  Resp: 18  Temp: 97.3 F  (36.3 C)   General: Well nourished, well developed female in no acute distress.  Skin: Warm and dry.  Cardiovascular:Regular rate and rhythm. Respiratory: Clear to auscultation bilateral. Normal respiratory effort Abdomen: mild Neuro/Psych: Normal mood and affect.   Pelvic exam: is not limited by body habitus EGBUS: within normal limits Vagina: within normal limits and with normal mucosa, no blood in the vault Cervix: closed, soft, thick, high, Vtx Uterus: Uterus demonstrates irritability pattern.  Adnexa: not evaluated  EFM:FHR: 140 bpm, variability: moderate,  accelerations:  Present,  decelerations:  Absent Toco: None  Assessment & Plan:   30 y.o. [id number] @ 172w0d, Admitted on 01/21/2016: abdominal pain and diarrhea  Fluids, Rest, Immodium, Monitor for s/sx PTL (none now), Tylenol.

## 2016-01-21 NOTE — Progress Notes (Signed)
Pain medication given along with water, PO hydrating. Pt. Tolerating well.

## 2016-01-21 NOTE — Discharge Summary (Signed)
Physician Discharge Summary  Patient ID: Kim MiloKrystle Sandles MRN: [id number] DOB/AGE: [birth year] 30 y.o.  Admit date: 01/21/2016 Discharge date: 01/21/2016  Admission Diagnoses: Abdominal pain and diarrhea  Discharge Diagnoses:  Active Problems:   Abdominal pain affecting pregnancy   Diarrhea  Discharged Condition: good  Hospital Course: Monitored for s/sx PTL.  Treatment options discussed for diarrhea.  Consults: None  Significant Diagnostic Studies: A NST procedure was performed with FHR monitoring and a normal baseline established, appropriate time of 20-40 minutes of evaluation, and accels >2 seen w 15x15 characteristics.  Results show a REACTIVE NST.   Treatments: none  Discharge Exam: Blood pressure 118/68, pulse 85, temperature 97.3 F (36.3 C), temperature source Oral, resp. rate 18, last menstrual period 06/03/2015. No change  Disposition: 01-Home or Self Care     Medication List    TAKE these medications   metoCLOPramide 10 MG tablet Commonly known as:  REGLAN Take 1 tablet (10 mg total) by mouth every 6 (six) hours as needed.   nitrofurantoin (macrocrystal-monohydrate) 100 MG capsule Commonly known as:  MACROBID Take 1 capsule (100 mg total) by mouth 2 (two) times daily.      Follow-up Information    Letitia Libraobert Paul Kathreen Dileo, MD. Go in 1 week(s).   Specialty:  Obstetrics and Gynecology Contact information: [address] [place] [address] [phone]           Signed: Letitia LibraRobert Paul Rilee Wendling 01/21/2016, 3:16 PM

## 2016-01-21 NOTE — OB Triage Note (Signed)
Starting lower abd. cramping this past Tuesday; cramping is getting stronger.  "Also it feels like someone has hit me in the lower back." Pain 6/10. Denies sudden gush of fluid or vaginal bleeding. Positive for fetal movement. Last sexual encounter was a couple of months ago.

## 2016-03-05 ENCOUNTER — Observation Stay
Admission: EM | Admit: 2016-03-05 | Discharge: 2016-03-05 | Disposition: A | Discharge status: Home / Self Care | Payer: Medicaid Other | Attending: Obstetrics and Gynecology | Admitting: Obstetrics and Gynecology

## 2016-03-05 DIAGNOSIS — Z3A40 40 weeks gestation of pregnancy: Secondary | ICD-10-CM | POA: Insufficient documentation

## 2016-03-05 DIAGNOSIS — O471 False labor at or after 37 completed weeks of gestation: Secondary | ICD-10-CM | POA: Diagnosis present

## 2016-03-05 DIAGNOSIS — O480 Post-term pregnancy: Secondary | ICD-10-CM | POA: Insufficient documentation

## 2016-03-07 ENCOUNTER — Inpatient Hospital Stay
Admission: EM | Admit: 2016-03-07 | Discharge: 2016-03-09 | DRG: 775 | Disposition: A | Discharge status: Home / Self Care | Payer: Medicaid Other | Attending: Certified Nurse Midwife | Admitting: Certified Nurse Midwife

## 2016-03-07 ENCOUNTER — Inpatient Hospital Stay: Payer: Medicaid Other | Admitting: Anesthesiology

## 2016-03-07 DIAGNOSIS — Z3493 Encounter for supervision of normal pregnancy, unspecified, third trimester: Secondary | ICD-10-CM | POA: Diagnosis present

## 2016-03-07 DIAGNOSIS — Z3A40 40 weeks gestation of pregnancy: Secondary | ICD-10-CM | POA: Diagnosis not present

## 2016-03-07 DIAGNOSIS — O99324 Drug use complicating childbirth: Secondary | ICD-10-CM | POA: Diagnosis present

## 2016-03-07 DIAGNOSIS — F1290 Cannabis use, unspecified, uncomplicated: Secondary | ICD-10-CM | POA: Diagnosis present

## 2016-03-07 DIAGNOSIS — F17210 Nicotine dependence, cigarettes, uncomplicated: Secondary | ICD-10-CM | POA: Diagnosis present

## 2016-03-07 DIAGNOSIS — O99334 Smoking (tobacco) complicating childbirth: Secondary | ICD-10-CM | POA: Diagnosis present

## 2016-03-07 DIAGNOSIS — O0930 Supervision of pregnancy with insufficient antenatal care, unspecified trimester: Secondary | ICD-10-CM

## 2016-03-07 LAB — CBC
HEMATOCRIT: 34.3 % — AB (ref 35.0–47.0)
HEMOGLOBIN: 12 g/dL (ref 12.0–16.0)
MCH: 33.3 pg (ref 26.0–34.0)
MCHC: 34.9 g/dL (ref 32.0–36.0)
MCV: 95.5 fL (ref 80.0–100.0)
Platelets: 272 10*3/uL (ref 150–440)
RBC: 3.59 MIL/uL — AB (ref 3.80–5.20)
RDW: 13.3 % (ref 11.5–14.5)
WBC: 11.5 10*3/uL — AB (ref 3.6–11.0)

## 2016-03-07 LAB — URINE DRUG SCREEN, QUALITATIVE (ARMC ONLY)
AMPHETAMINES, UR SCREEN: NOT DETECTED
BARBITURATES, UR SCREEN: NOT DETECTED
BENZODIAZEPINE, UR SCRN: NOT DETECTED
Cannabinoid 50 Ng, Ur ~~LOC~~: NOT DETECTED
Cocaine Metabolite,Ur ~~LOC~~: NOT DETECTED
MDMA (Ecstasy)Ur Screen: NOT DETECTED
Methadone Scn, Ur: NOT DETECTED
OPIATE, UR SCREEN: NOT DETECTED
Phencyclidine (PCP) Ur S: NOT DETECTED
Tricyclic, Ur Screen: NOT DETECTED

## 2016-03-07 LAB — CHLAMYDIA/NGC RT PCR (ARMC ONLY)
Chlamydia Tr: NOT DETECTED
N GONORRHOEAE: NOT DETECTED

## 2016-03-07 LAB — TYPE AND SCREEN
ABO/RH(D): AB POS
ANTIBODY SCREEN: NEGATIVE

## 2016-03-07 MED ORDER — IBUPROFEN 600 MG PO TABS
600.0000 mg | ORAL_TABLET | Freq: Four times a day (QID) | ORAL | Status: DC
  Administered 2016-03-07 – 2016-03-09 (×7): 600 mg via ORAL
  Filled 2016-03-07 (×7): qty 1

## 2016-03-07 MED ORDER — WITCH HAZEL-GLYCERIN EX PADS: 1.0000 "application " | MEDICATED_PAD | CUTANEOUS | Status: DC | PRN

## 2016-03-07 MED ORDER — IBUPROFEN 600 MG PO TABS
ORAL_TABLET | ORAL | Status: AC
  Administered 2016-03-07: 600 mg via ORAL
  Filled 2016-03-07: qty 1

## 2016-03-07 MED ORDER — SODIUM CHLORIDE 0.9 % IV SOLN
INTRAVENOUS | Status: DC | PRN
  Administered 2016-03-07 (×3): 5 mL via EPIDURAL

## 2016-03-07 MED ORDER — SENNOSIDES-DOCUSATE SODIUM 8.6-50 MG PO TABS
2.0000 | ORAL_TABLET | ORAL | Status: DC
  Administered 2016-03-08: 2 via ORAL
  Filled 2016-03-07: qty 2

## 2016-03-07 MED ORDER — ONDANSETRON HCL 4 MG/2ML IJ SOLN: 4.0000 mg | Freq: Three times a day (TID) | INTRAMUSCULAR | Status: DC | PRN

## 2016-03-07 MED ORDER — MEPERIDINE HCL 25 MG/ML IJ SOLN: 6.2500 mg | INTRAMUSCULAR | Status: DC | PRN

## 2016-03-07 MED ORDER — OXYTOCIN 40 UNITS IN LACTATED RINGERS INFUSION - SIMPLE MED: 2.5000 [IU]/h | INTRAVENOUS | Status: DC

## 2016-03-07 MED ORDER — FENTANYL 2.5 MCG/ML W/ROPIVACAINE 0.2% IN NS 100 ML EPIDURAL INFUSION (ARMC-ANES)
EPIDURAL | Status: AC
  Filled 2016-03-07: qty 100

## 2016-03-07 MED ORDER — NALBUPHINE HCL 10 MG/ML IJ SOLN: 5.0000 mg | INTRAMUSCULAR | Status: DC | PRN

## 2016-03-07 MED ORDER — LIDOCAINE-EPINEPHRINE (PF) 1.5 %-1:200000 IJ SOLN
INTRAMUSCULAR | Status: DC | PRN
  Administered 2016-03-07: 3 mL

## 2016-03-07 MED ORDER — LACTATED RINGERS IV SOLN: INTRAVENOUS | Status: DC

## 2016-03-07 MED ORDER — AMMONIA AROMATIC IN INHA
RESPIRATORY_TRACT | Status: AC
  Filled 2016-03-07: qty 10

## 2016-03-07 MED ORDER — NALBUPHINE HCL 10 MG/ML IJ SOLN: 5.0000 mg | Freq: Once | INTRAMUSCULAR | Status: DC | PRN

## 2016-03-07 MED ORDER — ONDANSETRON HCL 4 MG/2ML IJ SOLN: 4.0000 mg | Freq: Four times a day (QID) | INTRAMUSCULAR | Status: DC | PRN

## 2016-03-07 MED ORDER — COCONUT OIL OIL: 1.0000 "application " | TOPICAL_OIL | Status: DC | PRN

## 2016-03-07 MED ORDER — NALOXONE HCL 2 MG/2ML IJ SOSY: 1.0000 ug/kg/h | PREFILLED_SYRINGE | INTRAVENOUS | Status: DC | PRN

## 2016-03-07 MED ORDER — PRENATAL MULTIVITAMIN CH
1.0000 | ORAL_TABLET | Freq: Every day | ORAL | Status: DC
  Administered 2016-03-07 – 2016-03-08 (×2): 1 via ORAL
  Filled 2016-03-07 (×2): qty 1

## 2016-03-07 MED ORDER — ACETAMINOPHEN 325 MG PO TABS: 650.0000 mg | ORAL_TABLET | ORAL | Status: DC | PRN

## 2016-03-07 MED ORDER — DIPHENHYDRAMINE HCL 50 MG/ML IJ SOLN: 12.5000 mg | INTRAMUSCULAR | Status: DC | PRN

## 2016-03-07 MED ORDER — MISOPROSTOL 200 MCG PO TABS
ORAL_TABLET | ORAL | Status: AC
  Filled 2016-03-07: qty 4

## 2016-03-07 MED ORDER — OXYTOCIN 40 UNITS IN LACTATED RINGERS INFUSION - SIMPLE MED
INTRAVENOUS | Status: AC
  Administered 2016-03-07: 500 mL via INTRAVENOUS
  Filled 2016-03-07: qty 1000

## 2016-03-07 MED ORDER — LACTATED RINGERS IV SOLN
500.0000 mL | INTRAVENOUS | Status: DC | PRN
  Administered 2016-03-07: 500 mL via INTRAVENOUS

## 2016-03-07 MED ORDER — OXYCODONE-ACETAMINOPHEN 5-325 MG PO TABS: 1.0000 | ORAL_TABLET | ORAL | Status: DC | PRN

## 2016-03-07 MED ORDER — OXYCODONE-ACETAMINOPHEN 5-325 MG PO TABS
2.0000 | ORAL_TABLET | ORAL | Status: DC | PRN
  Administered 2016-03-07 – 2016-03-09 (×11): 2 via ORAL
  Filled 2016-03-07 (×11): qty 2

## 2016-03-07 MED ORDER — ONDANSETRON HCL 4 MG PO TABS: 4.0000 mg | ORAL_TABLET | ORAL | Status: DC | PRN

## 2016-03-07 MED ORDER — ONDANSETRON HCL 4 MG/2ML IJ SOLN: 4.0000 mg | INTRAMUSCULAR | Status: DC | PRN

## 2016-03-07 MED ORDER — FENTANYL 2.5 MCG/ML W/ROPIVACAINE 0.2% IN NS 100 ML EPIDURAL INFUSION (ARMC-ANES): 10.0000 mL/h | EPIDURAL | Status: DC

## 2016-03-07 MED ORDER — LIDOCAINE HCL (PF) 1 % IJ SOLN
INTRAMUSCULAR | Status: AC
  Filled 2016-03-07: qty 30

## 2016-03-07 MED ORDER — BUTORPHANOL TARTRATE 1 MG/ML IJ SOLN
1.0000 mg | INTRAMUSCULAR | Status: DC | PRN
  Administered 2016-03-07: 1 mg via INTRAVENOUS
  Filled 2016-03-07: qty 1

## 2016-03-07 MED ORDER — SIMETHICONE 80 MG PO CHEW: 80.0000 mg | CHEWABLE_TABLET | ORAL | Status: DC | PRN

## 2016-03-07 MED ORDER — BENZOCAINE-MENTHOL 20-0.5 % EX AERO
1.0000 "application " | INHALATION_SPRAY | CUTANEOUS | Status: DC | PRN
  Filled 2016-03-07: qty 56

## 2016-03-07 MED ORDER — DIBUCAINE 1 % RE OINT: 1.0000 "application " | TOPICAL_OINTMENT | RECTAL | Status: DC | PRN

## 2016-03-07 MED ORDER — SODIUM CHLORIDE 0.9% FLUSH: 3.0000 mL | INTRAVENOUS | Status: DC | PRN

## 2016-03-07 MED ORDER — SCOPOLAMINE 1 MG/3DAYS TD PT72: 1.0000 | MEDICATED_PATCH | Freq: Once | TRANSDERMAL | Status: DC

## 2016-03-07 MED ORDER — IBUPROFEN 600 MG PO TABS
600.0000 mg | ORAL_TABLET | Freq: Four times a day (QID) | ORAL | Status: DC
  Administered 2016-03-07: 600 mg via ORAL

## 2016-03-07 MED ORDER — DIPHENHYDRAMINE HCL 25 MG PO CAPS: 25.0000 mg | ORAL_CAPSULE | ORAL | Status: DC | PRN

## 2016-03-07 MED ORDER — OXYTOCIN BOLUS FROM INFUSION
500.0000 mL | Freq: Once | INTRAVENOUS | Status: AC
  Administered 2016-03-07: 500 mL via INTRAVENOUS

## 2016-03-07 MED ORDER — OXYTOCIN 10 UNIT/ML IJ SOLN
INTRAMUSCULAR | Status: AC
  Filled 2016-03-07: qty 2

## 2016-03-07 MED ORDER — FENTANYL 2.5 MCG/ML W/ROPIVACAINE 0.2% IN NS 100 ML EPIDURAL INFUSION (ARMC-ANES)
EPIDURAL | Status: DC | PRN
  Administered 2016-03-07: 10 mL/h via EPIDURAL

## 2016-03-07 MED ORDER — NALOXONE HCL 0.4 MG/ML IJ SOLN: 0.4000 mg | INTRAMUSCULAR | Status: DC | PRN

## 2016-03-07 NOTE — Progress Notes (Signed)
[facility] to [facility] patient up to BR for post delivery void.  Pt.'s left leg very weak, pt. Able to move leg, wiggle toes, lift leg off bed.  Nurse and SO assist pt. To BR for void.  Pt. Unable to void, pericare given, clean peripad and underwear put in place. Pt. Tolerated regular diet for breakfast; no emesis.  Family members and SO remains at the bedside.

## 2016-03-07 NOTE — H&P (Signed)
Obstetric History and Physical  Kim Kidd is a 30 y.o. [id number] with Estimated Date of Delivery: 03/03/16 per LMP and 28 wk US who presents at 5472w4d  presenting for labor. Patient states she has been having regular contractions, no vaginal bleeding, intact membranes, with active fetal movement.    Prenatal Course Source of Care: [facility]  with onset of care at 26 weeks Pregnancy complications or risks: -Inadequate care (only 2 visits) -H/o 20 wk placenta abruption  Patient Active Problem List   Diagnosis Date Noted  . Labor and delivery, indication for care 03/07/2016  . Labor and delivery indication for care or intervention 03/05/2016  . Abdominal pain affecting pregnancy 01/21/2016  . Diarrhea 01/21/2016   Prenatal labs and studies: ABO, Rh: AB+  Antibody: neg Rubella: Immune Varicella: Immune RPR:  Non-reactive HBsAg:  Neg HIV: Neg GC/CT: Neg/Neg GBS: Neg 1 hr Glucola: passed   Genetic screening: too late     Prenatal Transfer Tool   History reviewed. No pertinent past medical history.  Past Surgical History:  Procedure Laterality Date  . APPENDECTOMY    . CHOLECYSTECTOMY    . NEPHRECTOMY Left     OB History  Gravida Para Term Preterm AB Living  6 4 3 1 1 4   SAB TAB Ectopic Multiple Live Births          4    # Outcome Date GA Lbr Len/2nd Weight Sex Delivery Anes PTL Lv  6 Current           5 Preterm 10/05/07 7257w0d  2 lb (0.907 kg) M Vag-Spont   LIV  4 AB 05/19/06 4011w0d   M Vag-Spont   FD     Complications: Abruptio Placenta  3 Term 10/07/05 3134w0d  8 lb 3.5 oz (3.728 kg) M Vag-Spont   LIV  2 Term 05/23/04 4534w0d  8 lb 4 oz (3.742 kg) F Vag-Spont   LIV  1 Term 10/02/02 9193w0d  8 lb 6 oz (3.799 kg) M Vag-Spont   LIV      Social History   Social History  . Marital status: Single    Spouse name: N/A  . Number of children: N/A  . Years of education: N/A   Social History Main Topics  . Smoking status: Current Some Day Smoker    Packs/day: 0.50   Years: 15.00    Types: Cigarettes  . Smokeless tobacco: Current User  . Alcohol use No  . Drug use: No  . Sexual activity: No   Other Topics Concern  . None   Social History Narrative  . None    No family history on file.  Prescriptions Prior to Admission  Medication Sig Dispense Refill Last Dose  . [DISCONTINUED] metoCLOPramide (REGLAN) 10 MG tablet Take 1 tablet (10 mg total) by mouth every 6 (six) hours as needed. (Patient not taking: Reported on 03/07/2016) 12 tablet 0 Not Taking at Unknown time  . [DISCONTINUED] nitrofurantoin, macrocrystal-monohydrate, (MACROBID) 100 MG capsule Take 1 capsule (100 mg total) by mouth 2 (two) times daily. (Patient not taking: Reported on 03/07/2016) 20 capsule 0 Completed Course at Unknown time    No Known Allergies  Review of Systems: Negative except for what is mentioned in HPI.  Physical Exam: BP 119/78 (BP Location: Right Arm)   Pulse 78   Temp 98 F (36.7 C) (Oral)   Resp 18   Ht 5\' 7"  (1.702 m)   Wt 185 lb (83.9 kg)   LMP 06/03/2015   SpO2  100%   BMI 28.98 kg/m  GENERAL: Well-developed, well-nourished female in no acute distress.  LUNGS: Clear to auscultation bilaterally.  HEART: Regular rate and rhythm. ABDOMEN: Soft, nontender, nondistended, gravid. EXTREMITIES: Nontender, no edema Cervical Exam: Dilatation 8 cm   Effacement 90%   Station -1, -2 per RN's recent exam, 4.5 on presentation   Presentation: cephalic FHT: Category: 1 Baseline rate 130 bpm   Variability moderate  Accelerations present   Decelerations none Contractions: Every 2-3 mins   Pertinent Labs/Studies:   Results for orders placed or performed during the hospital encounter of 03/07/16 (from the past 24 hour(s))  CBC     Status: Abnormal   Collection Time: 03/07/16  3:42 AM  Result Value Ref Range   WBC 11.5 (H) 3.6 - 11.0 K/uL   RBC 3.59 (L) 3.80 - 5.20 MIL/uL   Hemoglobin 12.0 12.0 - 16.0 g/dL   HCT 16.134.3 (L) 09.635.0 - 04.547.0 %   MCV 95.5 80.0 - 100.0 fL    MCH 33.3 26.0 - 34.0 pg   MCHC 34.9 32.0 - 36.0 g/dL   RDW 40.913.3 81.111.5 - 91.414.5 %   Platelets 272 150 - 440 K/uL  Type and screen [facility]     Status: None   Collection Time: 03/07/16  3:42 AM  Result Value Ref Range   ABO/RH(D) AB POS    Antibody Screen NEG    Sample Expiration 03/10/2016   Urine Drug Screen, Qualitative ([facility] only)     Status: None   Collection Time: 03/07/16  3:48 AM  Result Value Ref Range   Tricyclic, Ur Screen NONE DETECTED NONE DETECTED   Amphetamines, Ur Screen NONE DETECTED NONE DETECTED   MDMA (Ecstasy)Ur Screen NONE DETECTED NONE DETECTED   Cocaine Metabolite,Ur [place] NONE DETECTED NONE DETECTED   Opiate, Ur Screen NONE DETECTED NONE DETECTED   Phencyclidine (PCP) Ur S NONE DETECTED NONE DETECTED   Cannabinoid 50 Ng, Ur [place] NONE DETECTED NONE DETECTED   Barbiturates, Ur Screen NONE DETECTED NONE DETECTED   Benzodiazepine, Ur Scrn NONE DETECTED NONE DETECTED   Methadone Scn, Ur NONE DETECTED NONE DETECTED  Chlamydia/NGC rt PCR ([facility] only)     Status: None   Collection Time: 03/07/16  3:48 AM  Result Value Ref Range   Specimen source GC/Chlam URINE, RANDOM    Chlamydia Tr NOT DETECTED NOT DETECTED   N gonorrhoeae NOT DETECTED NOT DETECTED    Assessment : IUP at 893w4d. Labor, inadequate PNC  Plan: Admitted for labor, currently with epidural. Anticipate vaginal delivery soon.  GBS unknown - no ppx indicated d/t term gestation

## 2016-03-07 NOTE — Discharge Summary (Signed)
Physician Obstetric Discharge Summary  Patient ID: Kim Kidd MRN: [id number] DOB/AGE: [birth year] 30 y.o.   Date of Admission: 03/07/2016  Date of Discharge: 03/09/16  Admitting Diagnosis: Onset of Labor at 9747w4d  Secondary Diagnosis: insufficient prenatal care, substance use during pregnancy  Mode of Delivery: normal spontaneous vaginal delivery 03/07/2017      Discharge Diagnosis: Term intrauterine pregnancy delivered, meconium stained amniotic fluid, nuchal cord x 2   Intrapartum Procedures: Atificial rupture of membranes and epidural   Post partum procedures: none  Complications: none   Brief Hospital Course  Kim Kidd is a [id number] who had a SVD on 03/07/2017;  for further details of this delivery, please refer to the delivery note.  Patient had an uncomplicated postpartum course.  By time of discharge on PPD#2, her pain was controlled on oral pain medications; she had appropriate lochia and was ambulating, voiding without difficulty and tolerating regular diet.  She was deemed stable for discharge to home.     Labs: CBC Latest Ref Rng & Units 03/08/2016 03/07/2016 09/16/2015  WBC 3.6 - 11.0 K/uL 10.6 11.5(H) 10.9  Hemoglobin 12.0 - 16.0 g/dL 1.4(N9.9(L) 82.912.0 56.212.7  Hematocrit 35.0 - 47.0 % 28.3(L) 34.3(L) 36.0  Platelets 150 - 440 K/uL 200 272 252   AB POS  Physical exam:  Blood pressure 125/77, pulse 63, temperature 98 F (36.7 C), temperature source Oral, resp. rate 18, height 5\' 7"  (1.702 m), weight 185 lb (83.9 kg), last menstrual period 06/03/2015, SpO2 100 %, unknown if currently breastfeeding. General: alert and no distress Lochia: appropriate Abdomen: soft, NT Uterine Fundus: firm Extremities: No evidence of DVT seen on physical exam. No lower extremity edema.  Discharge Instructions: Per After Visit Summary. Activity: Advance as tolerated. Pelvic rest for 6 weeks.  Also refer to Discharge Instructions Diet: Regular Medications: Allergies as of  03/09/2016   No Known Allergies     Medication List    You have not been prescribed any medications.    Outpatient follow up:  Follow-up Information    Letitia Libraobert Paul Everett Ehrler, MD. Schedule an appointment as soon as possible for a visit in 4 week(s).   Specialty:  Obstetrics and Gynecology Why:  Tubal ligation planning appt, postpartum check Contact information: [address] [place] [address] [phone]          Postpartum contraception: Plans tubal ligation  Discharged Condition: good  Discharged to: home   Newborn Data: Disposition:home with mother  Apgars: APGAR (1 MIN): 8   APGAR (5 MINS): 9   Baby Feeding: Bottle/ [place]  Letitia Libraobert Paul Marinell Igarashi, MD 03/09/2016 7:28 AM

## 2016-03-07 NOTE — Progress Notes (Signed)
Pt. Eating regular diet. Left leg remains weak, pt. Able to wiggle toes, unable to lift left leg off bed.

## 2016-03-07 NOTE — OB Triage Note (Addendum)
Pt arrived to triage, says she started having contractions about 2230, now coming strong with intense pain in lower back, lost mucus this am, denies vaginal bleeding, leaking or gush of fluid, n/v/d. Confirms +FM. GBS neg per pt report, unable to verify status in records. Last SVE done 2 days, this past Sat, 2.5 cm dilated at that time. Boy, would like Epidural for labor pain. Br/Bot, Peds unsure.

## 2016-03-07 NOTE — Anesthesia Procedure Notes (Signed)
Epidural Patient location during procedure: OB Start time: 03/07/2016 4:49 AM End time: 03/07/2016 5:06 AM  Staffing Anesthesiologist: Alver FisherPENWARDEN, Kim Kidd: anesthesiologist   Preanesthetic Checklist Completed: patient identified, site marked, surgical consent, pre-op evaluation, timeout Kidd, IV checked, risks and benefits discussed and monitors and equipment checked  Epidural Patient position: sitting Prep: ChloraPrep Patient monitoring: heart rate, continuous pulse ox and blood pressure Approach: midline Location: L4-L5 Injection technique: LOR saline  Needle:  Needle type: Tuohy  Needle gauge: 18 G Needle length: 9 cm and 9 Needle insertion depth: 6 cm Catheter type: closed end flexible Catheter size: 20 Guage Catheter at skin depth: 10 cm Test dose: negative (0.125% bupivacaine)  Assessment Events: blood not aspirated, injection not painful, no injection resistance, negative IV test and no paresthesia  Additional Notes   Patient tolerated the insertion well without complications.Reason for block:procedure for pain

## 2016-03-07 NOTE — Anesthesia Preprocedure Evaluation (Signed)
Anesthesia Evaluation  Patient identified by MRN, date of birth, ID band Patient awake    Reviewed: Allergy & Precautions, NPO status , Patient's Chart, lab work & pertinent test results  History of Anesthesia Complications Negative for: history of anesthetic complications  Airway Mallampati: II  TM Distance: >3 FB Neck ROM: Full    Dental no notable dental hx.    Pulmonary neg pulmonary ROS, neg sleep apnea, neg COPD, Current Smoker,    breath sounds clear to auscultation- rhonchi (-) wheezing      Cardiovascular Exercise Tolerance: Good (-) hypertension(-) CAD and (-) Past MI  Rhythm:Regular Rate:Normal - Systolic murmurs and - Diastolic murmurs    Neuro/Psych negative neurological ROS  negative psych ROS   GI/Hepatic negative GI ROS, Neg liver ROS,   Endo/Other  negative endocrine ROSneg diabetes  Renal/GU negative Renal ROS     Musculoskeletal negative musculoskeletal ROS (+)   Abdominal (+) - obese, Gravid abdomen  Peds  Hematology negative hematology ROS (+)   Anesthesia Other Findings   Reproductive/Obstetrics (+) Pregnancy                             Anesthesia Physical Anesthesia Plan  ASA: II  Anesthesia Plan: Epidural   Post-op Pain Management:    Induction:   Airway Management Planned:   Additional Equipment:   Intra-op Plan:   Post-operative Plan:   Informed Consent: I have reviewed the patients History and Physical, chart, labs and discussed the procedure including the risks, benefits and alternatives for the proposed anesthesia with the patient or authorized representative who has indicated his/her understanding and acceptance.     Plan Discussed with: Anesthesiologist  Anesthesia Plan Comments: (Plan for epidural for labor, discussed epidural vs spinal vs GA if need for csection)        Anesthesia Quick Evaluation

## 2016-03-07 NOTE — Discharge Instructions (Signed)
Vaginal Delivery, Care After Refer to this sheet in the next few weeks. These discharge instructions provide you with information on caring for yourself after delivery. Your caregiver may also give you specific instructions. Your treatment has been planned according to the most current medical practices available, but problems sometimes occur. Call your caregiver if you have any problems or questions after you go home. HOME CARE INSTRUCTIONS 1. Take over-the-counter or prescription medicines only as directed by your caregiver or pharmacist. 2. Do not drink alcohol, especially if you are breastfeeding or taking medicine to relieve pain. 3. Do not smoke tobacco. 4. Continue to use good perineal care. Good perineal care includes: 1. Wiping your perineum from back to front 2. Keeping your perineum clean. 3. You can do sitz baths twice a day, to help keep this area clean 5. Do not use tampons, douche or have sex until your caregiver says it is okay. 6. Shower only and avoid sitting in submerged water, aside from sitz baths 7. Wear a well-fitting bra that provides breast support. 8. Eat healthy foods. 9. Drink enough fluids to keep your urine clear or pale yellow. 10. Eat high-fiber foods such as whole grain cereals and breads, brown rice, beans, and fresh fruits and vegetables every day. These foods may help prevent or relieve constipation. 11. Avoid constipation with high fiber foods or medications, such as miralax or metamucil 12. Follow your caregiver's recommendations regarding resumption of activities such as climbing stairs, driving, lifting, exercising, or traveling. 13. Talk to your caregiver about resuming sexual activities. Resumption of sexual activities is dependent upon your risk of infection, your rate of healing, and your comfort and desire to resume sexual activity. 14. Try to have someone help you with your household activities and your newborn for at least a few days after you leave  the hospital. 15. Rest as much as possible. Try to rest or take a nap when your newborn is sleeping. 16. Increase your activities gradually. 17. Keep all of your scheduled postpartum appointments. It is very important to keep your scheduled follow-up appointments. At these appointments, your caregiver will be checking to make sure that you are healing physically and emotionally. SEEK MEDICAL CARE IF:   You are passing large clots from your vagina. Save any clots to show your caregiver.  You have a foul smelling discharge from your vagina.  You have trouble urinating.  You are urinating frequently.  You have pain when you urinate.  You have a change in your bowel movements.  You have increasing redness, pain, or swelling near your vaginal incision (episiotomy) or vaginal tear.  You have pus draining from your episiotomy or vaginal tear.  Your episiotomy or vaginal tear is separating.  You have painful, hard, or reddened breasts.  You have a severe headache.  You have blurred vision or see spots.  You feel sad or depressed.  You have thoughts of hurting yourself or your newborn.  You have questions about your care, the care of your newborn, or medicines.  You are dizzy or light-headed.  You have a rash.  You have nausea or vomiting.  You were breastfeeding and have not had a menstrual period within 12 weeks after you stopped breastfeeding.  You are not breastfeeding and have not had a menstrual period by the 12th week after delivery.  You have a fever. SEEK IMMEDIATE MEDICAL CARE IF:   You have persistent pain.  You have chest pain.  You have shortness of breath.    You faint.  You have leg pain.  You have stomach pain.  Your vaginal bleeding saturates two or more sanitary pads in 1 hour. MAKE SURE YOU:   Understand these instructions.  Will watch your condition.  Will get help right away if you are not doing well or get worse. Document Released:  03/03/2000 Document Revised: 07/21/2013 Document Reviewed: 11/01/2011 ExitCare Patient Information 2015 [employer]. This information is not intended to replace advice given to you by your health care provider. Make sure you discuss any questions you have with your health care provider.  Sitz Bath A sitz bath is a warm water bath taken in the sitting position. The water covers only the hips and butt (buttocks). We recommend using one that fits in the toilet, to help with ease of use and cleanliness. It may be used for either healing or cleaning purposes. Sitz baths are also used to relieve pain, itching, or muscle tightening (spasms). The water may contain medicine. Moist heat will help you heal and relax.  HOME CARE  Take 3 to 4 sitz baths a day. 18. Fill the bathtub half-full with warm water. 19. Sit in the water and open the drain a little. 20. Turn on the warm water to keep the tub half-full. Keep the water running constantly. 21. Soak in the water for 15 to 20 minutes. 22. After the sitz bath, pat the affected area dry. GET HELP RIGHT AWAY IF: You get worse instead of better. Stop the sitz baths if you get worse. MAKE SURE YOU:  Understand these instructions.  Will watch your condition.  Will get help right away if you are not doing well or get worse. Document Released: 04/13/2004 Document Revised: 11/29/2011 Document Reviewed: 07/04/2010 ExitCare Patient Information 2015 [employer]. This information is not intended to replace advice given to you by your health care provider. Make sure you discuss any questions you have with your health care provider.    

## 2016-03-08 LAB — CBC
HEMATOCRIT: 28.3 % — AB (ref 35.0–47.0)
Hemoglobin: 9.9 g/dL — ABNORMAL LOW (ref 12.0–16.0)
MCH: 33.6 pg (ref 26.0–34.0)
MCHC: 35.1 g/dL (ref 32.0–36.0)
MCV: 95.9 fL (ref 80.0–100.0)
PLATELETS: 200 10*3/uL (ref 150–440)
RBC: 2.96 MIL/uL — AB (ref 3.80–5.20)
RDW: 12.9 % (ref 11.5–14.5)
WBC: 10.6 10*3/uL (ref 3.6–11.0)

## 2016-03-08 NOTE — Anesthesia Postprocedure Evaluation (Signed)
Anesthesia Post Note  Patient: Kim Kidd  Procedure(s) Performed: * No procedures listed *  Patient location during evaluation: Mother Baby Anesthesia Type: Epidural Level of consciousness: awake and alert and oriented Pain management: pain level controlled Vital Signs Assessment: post-procedure vital signs reviewed and stable Respiratory status: spontaneous breathing Cardiovascular status: stable Postop Assessment: no headache Anesthetic complications: no     Last Vitals:  Vitals:   03/07/16 2341 03/08/16 0355  BP: 102/66 (!) 98/52  Pulse: 67 (!) 58  Resp: 16 17  Temp: 36.9 C 36.5 C    Last Pain:  Vitals:   03/08/16 0651  TempSrc:   PainSc: 6                  Maxxwell Edgett,  Alessandra BevelsJennifer M

## 2016-03-08 NOTE — Progress Notes (Signed)
Admit Date: 03/07/2016 Today's Date: 03/08/2016  Post Partum Day 1  Subjective:  no complaints, up ad lib, voiding and tolerating PO  Objective: Temp:  [97.7 F (36.5 C)-98.5 F (36.9 C)] 98.2 F (36.8 C) (12/20 0756) Pulse Rate:  [58-71] 61 (12/20 0756) Resp:  [16-20] 18 (12/20 0756) BP: (98-102)/(52-73) 101/73 (12/20 0756) SpO2:  [98 %-100 %] 100 % (12/20 0756)  Physical Exam:  General: alert, cooperative and no distress Lochia: appropriate Uterine Fundus: firm Incision: none DVT Evaluation: No evidence of DVT seen on physical exam.   Recent Labs  03/07/16 0342 03/08/16 0618  HGB 12.0 9.9*  HCT 34.3* 28.3*    Assessment/Plan: Plan for discharge tomorrow, Bottle Feeding and Infant doing well  Contraception- desires BTL (has 5 children) but has not signed tubal consent papers yet.  Will try to arrange to have signed here before discharge and then plan interval tubal.   LOS: 1 day   Letitia Libraobert Paul Shalon Salado [facility] 03/08/2016, 11:28 AM

## 2016-03-08 NOTE — Clinical Social Work Maternal (Signed)
  CLINICAL SOCIAL WORK MATERNAL/CHILD NOTE  Patient Details  Name: Kim Kidd MRN: [id number] Date of Birth: 1985/08/27  Date:  03/08/2016  Clinical Social Worker Initiating Note:  Shela Leff MSW,LCSW Date/ Time Initiated:  03/08/16/      Child's Name:      Legal Guardian:  Mother   Need for Interpreter:  None   Date of Referral:        Reason for Referral:  Current Substance Use/Substance Use During Pregnancy    Referral Source:  RN   Address:     Phone number:      Household Members:  Self, Minor Children, Parents   Natural Supports (not living in the home):  Extended Family, Friends   Medical illustrator Supports: None   Employment: Full-time   Type of Work:     Education:      Pensions consultant:  [employer]   Other Resources:  [employer]   Cultural/Religious Considerations Which May Impact Care:  none  Strengths:  Ability to meet basic needs , Home prepared for child    Risk Factors/Current Problems:  Substance Use    Cognitive State:  Alert , Able to Concentrate    Mood/Affect:  Calm , Happy    CSW Assessment: CSW consulted due to substance use. CSW met with patient this morning and her visitors stepped out of the room while we spoke. Patient states that she has four other children but not all are in the home with her. Her daughter lives with her mother and her other three sons live with their father. Patient reported that she has all necessities for her newborn. She stated that transportation was an issue in the beginning of her pregnancy and this is why she had late prenatal care. Patient reports that she now has access to transportation and this will not be a concern. Patient reports that she used marijuana as a calming agent. Patient states she has not used in several months. CSW explained that the cord blood was being tested and if it came back positive, that a DSS CPS report would need to be made. Patient verbalized understanding and stated that she  smoked marijuana with her first child and that DSS CPS was involved at that time. Patient states she is not concerned but states she does not intend to continue to use marijuana. Patient states she did not use around her other children and she states she does not smoke cigarettes around her other children.   CSW inquired about any mental illness history and patient states she has battled depression and that she sees a therapist at [facility] for this. Patient vocalized no further concerns at this time.   CSW Plan/Description:  Psychosocial Support and Ongoing Assessment of Needs    Shela Leff, LCSW 03/08/2016, 12:30 PM

## 2016-03-09 NOTE — Progress Notes (Signed)
Admit Date: 03/07/2016 Today's Date: 03/09/2016  Post Partum Day 2  Subjective:  no complaints, up ad lib, voiding and tolerating PO  Objective: Temp:  [98 F (36.7 C)-98.4 F (36.9 C)] 98 F (36.7 C) (12/20 1915) Pulse Rate:  [61-63] 63 (12/20 1915) Resp:  [18] 18 (12/20 1915) BP: (101-125)/(64-77) 125/77 (12/20 1915) SpO2:  [100 %] 100 % (12/20 0756)  Physical Exam:  General: alert, cooperative and no distress Lochia: appropriate Uterine Fundus: firm Incision: none DVT Evaluation: No evidence of DVT seen on physical exam.   Recent Labs  03/07/16 0342 03/08/16 0618  HGB 12.0 9.9*  HCT 34.3* 28.3*    Assessment/Plan: Plan for discharge, Bottle Feeding and Infant doing well  Contraception- desires BTL (has 5 children) and hast signed tubal consent papers yesterday.  plan interval tubal.   LOS: 2 days   Letitia Libraobert Paul Venetta Knee [facility] 03/09/2016, 7:29 AM

## 2016-03-09 NOTE — Discharge Summary (Addendum)
Patient presented for evaluation of labor.  Patient had cervical exam by RN and this was reported to me. I reviewed her vital signs and fetal tracing, both of which were reassuring.  Patient was discharge as she was not laboring.   Gestational age at this encounter is 7963w2d gestation.  Thomasene MohairStephen Nicholl Onstott, MD 03/09/2016 3:10 PM

## 2016-03-09 NOTE — Progress Notes (Signed)
Patient understands all discharge instructions and the need to make follow up appointments. Patient discharge via wheelchair with RN. 

## 2016-03-09 NOTE — Progress Notes (Signed)
Patient requested and give permission to provide a work note for her boyfriend Kim Kidd.

## 2016-05-01 ENCOUNTER — Emergency Department
Admission: EM | Admit: 2016-05-01 | Discharge: 2016-05-01 | Disposition: A | Discharge status: Home / Self Care | Payer: Medicaid Other | Attending: Emergency Medicine | Admitting: Emergency Medicine

## 2016-05-01 ENCOUNTER — Encounter: Payer: Self-pay | Admitting: *Deleted

## 2016-05-01 DIAGNOSIS — K047 Periapical abscess without sinus: Secondary | ICD-10-CM | POA: Diagnosis not present

## 2016-05-01 DIAGNOSIS — F17210 Nicotine dependence, cigarettes, uncomplicated: Secondary | ICD-10-CM | POA: Insufficient documentation

## 2016-05-01 DIAGNOSIS — K0889 Other specified disorders of teeth and supporting structures: Secondary | ICD-10-CM | POA: Diagnosis present

## 2016-05-01 MED ORDER — TRAMADOL HCL 50 MG PO TABS: 50.0000 mg | ORAL_TABLET | Freq: Four times a day (QID) | ORAL | 0 refills | Status: DC | PRN

## 2016-05-01 MED ORDER — NAPROXEN 500 MG PO TABS: 500.0000 mg | ORAL_TABLET | Freq: Two times a day (BID) | ORAL | 0 refills | Status: DC

## 2016-05-01 MED ORDER — AMOXICILLIN 500 MG PO TABS: 500.0000 mg | ORAL_TABLET | Freq: Three times a day (TID) | ORAL | 0 refills | Status: DC

## 2016-05-01 NOTE — Discharge Instructions (Signed)
OPTIONS FOR DENTAL FOLLOW UP CARE ° °[place] [facility] Health and Human Services - Local Safety Net Dental Clinics °http://www.ncdhhs.gov/dph/oralhealth/services/safetynetclinics.htm °  °[facility] (336-562-3123) ° °[facility] [place] (919-933-9087) ° °[facility] (919-663-1744 ext 237) ° °[facility] ([phone]) ° °[facility] ([phone]) °This clinic caters to the indigent population and is on a lottery system. °Location: °[facility], [address], [place] °Clinic Hours: °Wednesdays from 6pm - 9pm, patients seen by a lottery system. °For dates, call or go to www.med.[facility].edu/[facility]/patients/Dental-[facility] °Services: °Cleanings, fillings and simple extractions. °Payment Options: °DENTAL WORK IS FREE OF CHARGE. Bring proof of income or support. °Best way to get seen: °Arrive at 5:15 pm - this is a lottery, NOT first come/first serve, so arriving earlier will not increase your chances of being seen. °  °  °[facility] °919-537-3737 °Select option 1 for emergencies °  °Location: °[facility], [address], [place] °Clinic Hours: °No walk-ins accepted - call the day before to schedule an appointment. °Check in times are 9:30 am and 1:30 pm. °Services: °Simple extractions, temporary fillings, pulpectomy/pulp debridement, uncomplicated abscess drainage. °Payment Options: °PAYMENT IS DUE AT THE TIME OF SERVICE.  Fee is usually $100-200, additional surgical procedures (e.g. abscess drainage) may be extra. °Cash, checks, Visa/MasterCard accepted.  Can file Medicaid if patient is covered for dental - patient should call case worker to check. °No discount for [facility] patients. °Best way to get seen: °MUST call the day before and get onto the schedule. Can usually be seen the next 1-2 days. No walk-ins accepted. °  °  °[facility] °[phone] °   °Location: °[facility], [address], [place] °Clinic Hours: °M, W, Th, F 8am or 1:30pm, Tues 9a or 1:30 - first come/first served. °Services: °Simple extractions, temporary fillings, uncomplicated abscess drainage.  You do not need to be an [place] resident. °Payment Options: °PAYMENT IS DUE AT THE TIME OF SERVICE. °Dental insurance, otherwise sliding scale - bring proof of income or support. °Depending on income and treatment needed, cost is usually $50-200. °Best way to get seen: °Arrive early as it is first come/first served. °  °  °[facility] °919-542-1641 °  °Location: °[address] °Clinic Hours: °Mon-Thu 8a-5p °Services: °Most basic dental services including extractions and fillings. °Payment Options: °PAYMENT IS DUE AT THE TIME OF SERVICE. °Sliding scale, up to 50% off - bring proof if income or support. °Medicaid with dental option accepted. °Best way to get seen: °Call to schedule an appointment, can usually be seen within 2 weeks OR they will try to see walk-ins - show up at 8a or 2p (you may have to wait). °  °  °[facility] °919-245-2435 °[place] RESIDENTS ONLY °  °Location: °[facility], [address], [place], [place] [address] °Clinic Hours: By appointment only. °Monday - Thursday 8am-5pm, Friday 8am-12pm °Services: Cleanings, fillings, extractions. °Payment Options: °PAYMENT IS DUE AT THE TIME OF SERVICE. °Cash, Visa or MasterCard. Sliding scale - $30 minimum per service. °Best way to get seen: °Come in to office, complete packet and make an appointment - need proof of income °or support monies for each household member and proof of [place] residence. °Usually takes about a month to get in. °  °  °[facility] °[phone] °  °Location: °[address]   [place] °Clinic Hours: Walk-in Urgent Care Dental Services are offered Monday-Friday  mornings only. °The numbers of emergencies accepted daily is limited to the number of °providers available. °Maximum 15 - Mondays, Wednesdays & Thursdays °Maximum 10 - Tuesdays & Fridays °Services: °You do not need to be a [place] resident to be seen for a dental emergency. °Emergencies are defined as pain, swelling, abnormal bleeding, or dental trauma. Walkins will receive x-rays if needed. °NOTE: Dental cleaning is not an emergency. °Payment Options: °PAYMENT IS DUE AT THE TIME OF SERVICE. °Minimum co-pay is $40.00 for uninsured patients. °Minimum co-pay is $3.00 for Medicaid with dental coverage. °Dental Insurance is accepted and must be presented at time of visit. °Medicare does not cover dental. °Forms of payment: Cash, credit card, checks. °Best way to get seen: °If not previously registered with the clinic, walk-in dental registration begins at 7:15 am and is on a first come/first serve basis. °If previously registered with the clinic, call to make an appointment. °  °  °[facility] °919-776-4359 °[place] RESIDENTS ONLY °  °Location: °[address], [place], [place] °Clinic Hours: °Mon-Thu 10a-2p °Services: Extractions only! °Payment Options: °FREE (donations accepted) - bring proof of income or support °Best way to get seen: °Call and schedule an appointment OR come at 8am on the 1st Monday of every month (except for holidays) when it is first come/first served. °  °  °[facility] °[phone] °  °Location: °[address], [place] °Clinic Hours: °Friday mornings °Services, Payment Options, Best way to get seen: °Call for info °

## 2016-05-01 NOTE — ED Triage Notes (Signed)
PT reports left upper side abscess and dental pain. PT reports abscess formed last week and ruptured and then reformed and ruptured again. Pt reports pain and increased pain with chewing. Pt denies having seen a dentist.

## 2016-05-01 NOTE — ED Notes (Signed)
See triage note  States she has a broken tooth to left upper side of mouth for about 2 months   Pain became worse over the past 2 weeks

## 2016-05-01 NOTE — ED Provider Notes (Signed)
[facility] Emergency Department Provider Note ____________________________________________  Time seen: Approximately 4:16 PM  I have reviewed the triage vital signs and the nursing notes.   HISTORY  Chief Complaint Dental Injury   HPI Kim Kidd is a 31 y.o. female who presents to the ER for evaluation of dental pain. She states that she's had an abscess to the upper left since last week. She states that she noticed that it ruptured, but then formed and ruptured again. Pain has increased over the past couple of days and increases with chewing. She denies fever. She has had no relief with ibuprofen 800 mg.  History reviewed. No pertinent past medical history.  Patient Active Problem List   Diagnosis Date Noted  . Insufficient prenatal care 03/07/2016  . 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 Postpartum care following vaginal delivery 03/07/2016    Past Surgical History:  Procedure Laterality Date  . APPENDECTOMY    . CHOLECYSTECTOMY    . NEPHRECTOMY Left     Prior to Admission medications   Medication Sig Start Date End Date Taking? Authorizing Provider  amoxicillin (AMOXIL) 500 MG tablet Take 1 tablet (500 mg total) by mouth 3 (three) times daily. 05/01/16   Chinita Pester, FNP  naproxen (NAPROSYN) 500 MG tablet Take 1 tablet (500 mg total) by mouth 2 (two) times daily with a meal. 05/01/16   Chinita Pester, FNP    Allergies Patient has no known allergies.  History reviewed. No pertinent family history.  Social History Social History  Substance Use Topics  . Smoking status: Current Some Day Smoker    Packs/day: 0.50    Years: 15.00    Types: Cigarettes  . Smokeless tobacco: Current User  . Alcohol use No    Review of Systems Constitutional: Well appearing. No fever. ENT: Positive for dental pain Musculoskeletal: Negative for jaw pain/trismus. Skin: Negative for lesion or  swelling ____________________________________________   PHYSICAL EXAM:  VITAL SIGNS: ED Triage Vitals  Enc Vitals Group     BP 05/01/16 1607 125/79     Pulse Rate 05/01/16 1607 77     Resp 05/01/16 1607 16     Temp 05/01/16 1607 98 F (36.7 C)     Temp Source 05/01/16 1607 Oral     SpO2 05/01/16 1607 100 %     Weight 05/01/16 1604 168 lb (76.2 kg)     Height 05/01/16 1604 5\' 7"  (1.702 m)     Head Circumference --      Peak Flow --      Pain Score 05/01/16 1604 7     Pain Loc --      Pain Edu? --      Excl. in GC? --     Constitutional: Alert and oriented. Well appearing and in no acute distress. Eyes: Conjunctivae are normal.EOMI. Mouth/Throat: Mucous membranes are moist. Oropharynx non-erythematous. Periodontal Exam    Hematological/Lymphatic/Immunilogical: No cervical lymphadenopathy. Respiratory: Normal respiratory effort.  Musculoskeletal: Full ROM x 4 extremities. Neurologic:  Normal speech and language. No gross focal neurologic deficits are appreciated. Speech is normal. No gait instability. Skin:  Warm and dry. No swelling or erythema associated with dental pain. Psychiatric: Mood and affect are normal. Speech and behavior are normal.  ____________________________________________   LABS (all labs ordered are listed, but only abnormal results are displayed)  Labs Reviewed - No data to display ____________________________________________   RADIOLOGY  Not indicated ____________________________________________   PROCEDURES  Procedure(s) performed: None  Critical Care performed:  No  ____________________________________________   INITIAL IMPRESSION / ASSESSMENT AND PLAN / ED COURSE  Pertinent labs & imaging results that were available during my care of the patient were reviewed by me and considered in my medical decision making (see chart for details).  31 year old female presenting to the emergency department for treatment of dental pain. She  will be prescribed amoxicillin to be taken 3 times per day and Naprosyn. She was instructed to follow-up with the dentist within 2 weeks. She was given a list of dental clinics and advised to call in the morning to schedule an appointment. ____________________________________________   FINAL CLINICAL IMPRESSION(S) / ED DIAGNOSES  Final diagnoses:  Dental abscess    Note:  This document was prepared using Dragon voice recognition software and may include unintentional dictation errors.     Chinita PesterCari B Jhoanna Heyde, FNP 05/01/16 1655    Rockne MenghiniAnne-Caroline Norman, MD 05/09/16 [phone]

## 2016-11-16 ENCOUNTER — Emergency Department: Payer: Medicaid Other

## 2016-11-16 ENCOUNTER — Emergency Department
Admission: EM | Admit: 2016-11-16 | Discharge: 2016-11-17 | Disposition: A | Discharge status: Home / Self Care | Payer: Medicaid Other | Attending: Student in an Organized Health Care Education/Training Program | Admitting: Student in an Organized Health Care Education/Training Program

## 2016-11-16 ENCOUNTER — Encounter: Payer: Self-pay | Admitting: Emergency Medicine

## 2016-11-16 DIAGNOSIS — F17210 Nicotine dependence, cigarettes, uncomplicated: Secondary | ICD-10-CM | POA: Diagnosis not present

## 2016-11-16 DIAGNOSIS — O9989 Other specified diseases and conditions complicating pregnancy, childbirth and the puerperium: Secondary | ICD-10-CM | POA: Diagnosis present

## 2016-11-16 DIAGNOSIS — O219 Vomiting of pregnancy, unspecified: Secondary | ICD-10-CM | POA: Insufficient documentation

## 2016-11-16 DIAGNOSIS — M25571 Pain in right ankle and joints of right foot: Secondary | ICD-10-CM | POA: Diagnosis not present

## 2016-11-16 DIAGNOSIS — R1011 Right upper quadrant pain: Secondary | ICD-10-CM | POA: Diagnosis not present

## 2016-11-16 DIAGNOSIS — O2342 Unspecified infection of urinary tract in pregnancy, second trimester: Secondary | ICD-10-CM | POA: Diagnosis not present

## 2016-11-16 DIAGNOSIS — R112 Nausea with vomiting, unspecified: Secondary | ICD-10-CM

## 2016-11-16 DIAGNOSIS — O99332 Smoking (tobacco) complicating pregnancy, second trimester: Secondary | ICD-10-CM | POA: Insufficient documentation

## 2016-11-16 DIAGNOSIS — Z3490 Encounter for supervision of normal pregnancy, unspecified, unspecified trimester: Secondary | ICD-10-CM

## 2016-11-16 DIAGNOSIS — Z791 Long term (current) use of non-steroidal anti-inflammatories (NSAID): Secondary | ICD-10-CM | POA: Diagnosis not present

## 2016-11-16 DIAGNOSIS — Z3A15 15 weeks gestation of pregnancy: Secondary | ICD-10-CM | POA: Insufficient documentation

## 2016-11-16 DIAGNOSIS — N3000 Acute cystitis without hematuria: Secondary | ICD-10-CM

## 2016-11-16 LAB — CBC
HEMATOCRIT: 30.4 % — AB (ref 35.0–47.0)
HEMOGLOBIN: 10.7 g/dL — AB (ref 12.0–16.0)
MCH: 32.8 pg (ref 26.0–34.0)
MCHC: 35.2 g/dL (ref 32.0–36.0)
MCV: 93.3 fL (ref 80.0–100.0)
Platelets: 208 10*3/uL (ref 150–440)
RBC: 3.26 MIL/uL — AB (ref 3.80–5.20)
RDW: 13.3 % (ref 11.5–14.5)
WBC: 7.8 10*3/uL (ref 3.6–11.0)

## 2016-11-16 LAB — URINALYSIS, COMPLETE (UACMP) WITH MICROSCOPIC
BILIRUBIN URINE: NEGATIVE
GLUCOSE, UA: NEGATIVE mg/dL
KETONES UR: 20 mg/dL — AB
NITRITE: NEGATIVE
PROTEIN: NEGATIVE mg/dL
Specific Gravity, Urine: 1.002 — ABNORMAL LOW (ref 1.005–1.030)
pH: 6 (ref 5.0–8.0)

## 2016-11-16 LAB — COMPREHENSIVE METABOLIC PANEL
ALT: 9 U/L — ABNORMAL LOW (ref 14–54)
ANION GAP: 7 (ref 5–15)
AST: 12 U/L — ABNORMAL LOW (ref 15–41)
Albumin: 3.4 g/dL — ABNORMAL LOW (ref 3.5–5.0)
Alkaline Phosphatase: 47 U/L (ref 38–126)
BILIRUBIN TOTAL: 0.7 mg/dL (ref 0.3–1.2)
BUN: 7 mg/dL (ref 6–20)
CALCIUM: 8.8 mg/dL — AB (ref 8.9–10.3)
CO2: 25 mmol/L (ref 22–32)
Chloride: 101 mmol/L (ref 101–111)
Creatinine, Ser: 0.6 mg/dL (ref 0.44–1.00)
GFR calc non Af Amer: >60 mL/min (ref >60)
Glucose, Bld: 116 mg/dL — ABNORMAL HIGH (ref 65–99)
POTASSIUM: 3.3 mmol/L — AB (ref 3.5–5.1)
Sodium: 133 mmol/L — ABNORMAL LOW (ref 135–145)
TOTAL PROTEIN: 7.5 g/dL (ref 6.5–8.1)

## 2016-11-16 LAB — LIPASE, BLOOD: Lipase: 20 U/L (ref 11–51)

## 2016-11-16 LAB — HCG, QUANTITATIVE, PREGNANCY: hCG, Beta Chain, Quant, S: 38294 m[IU]/mL — ABNORMAL HIGH (ref <5)

## 2016-11-16 MED ORDER — DOXYLAMINE-PYRIDOXINE 10-10 MG PO TBEC: 1.0000 | DELAYED_RELEASE_TABLET | Freq: Two times a day (BID) | ORAL | 0 refills | Status: DC

## 2016-11-16 MED ORDER — ACETAMINOPHEN 500 MG PO TABS
1000.0000 mg | ORAL_TABLET | Freq: Once | ORAL | Status: AC
  Administered 2016-11-16: 1000 mg via ORAL
  Filled 2016-11-16 (×2): qty 2

## 2016-11-16 MED ORDER — DEXTROSE 5 % IV SOLN
INTRAVENOUS | Status: AC
  Filled 2016-11-16: qty 10

## 2016-11-16 MED ORDER — CEPHALEXIN 500 MG PO CAPS: 500.0000 mg | ORAL_CAPSULE | Freq: Three times a day (TID) | ORAL | 0 refills | Status: AC

## 2016-11-16 MED ORDER — SODIUM CHLORIDE 0.9 % IV BOLUS (SEPSIS)
1000.0000 mL | Freq: Once | INTRAVENOUS | Status: AC
  Administered 2016-11-16: 1000 mL via INTRAVENOUS

## 2016-11-16 MED ORDER — CEFTRIAXONE SODIUM 1 G IJ SOLR
1.0000 g | Freq: Once | INTRAMUSCULAR | Status: AC
  Administered 2016-11-16: 1 g via INTRAVENOUS

## 2016-11-16 NOTE — ED Notes (Signed)
Patient discharged to home per MD order. Patient in stable condition, and deemed medically cleared by ED provider for discharge. Discharge instructions reviewed with patient/family using "Teach Back"; verbalized understanding of medication education and administration, and information about follow-up care. Denies further concerns. ° °

## 2016-11-16 NOTE — ED Triage Notes (Signed)
Pt reports right side abdominal pain and vomiting that began Monday. Pt reports associated chills. Pt reports she is [redacted] weeks pregnant. Pt reports shut right foot in car door today and has right foot pain as well.

## 2016-11-16 NOTE — ED Provider Notes (Signed)
[facility] Emergency Department Provider Note    First MD Initiated Contact with Patient 11/16/16 [phone]     (approximate)  I have reviewed the triage vital signs and the nursing notes.   HISTORY  Chief Complaint Abdominal Pain and Emesis    HPI Kim Kidd is a 31 y.o. female [phone]  Who is roughly [redacted] weeks pregnant presents with several days of right upper quadrant and right flank pain associated with nausea and vomiting. Has had chills but no measured fevers. She status post cholecystectomy and appendectomy. She denies any hematuria. No history of kidney stones but does have a single kidney. No vaginal bleeding or discharge. No chest pain or shortness of breath.    History reviewed. No pertinent past medical history. No family history on file. Past Surgical History:  Procedure Laterality Date  . APPENDECTOMY    . CHOLECYSTECTOMY    . NEPHRECTOMY Left    Patient Active Problem List   Diagnosis Date Noted  . Insufficient prenatal care 03/07/2016  . 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 Postpartum care following vaginal delivery 03/07/2016      Prior to Admission medications   Medication Sig Start Date End Date Taking? Authorizing Provider  amoxicillin (AMOXIL) 500 MG tablet Take 1 tablet (500 mg total) by mouth 3 (three) times daily. 05/01/16   Triplett, Cari B, FNP  cephALEXin (KEFLEX) 500 MG capsule Take 1 capsule (500 mg total) by mouth 3 (three) times daily. 11/16/16 11/23/16  Willy Eddy, MD  Doxylamine-Pyridoxine 10-10 MG TBEC Take 1 tablet by mouth 2 (two) times daily. 11/16/16   Willy Eddy, MD  naproxen (NAPROSYN) 500 MG tablet Take 1 tablet (500 mg total) by mouth 2 (two) times daily with a meal. 05/01/16   Triplett, Kasandra Knudsen, FNP    Allergies Penicillins    Social History Social History  Substance Use Topics  . Smoking status: Current Some Day Smoker    Packs/day: 0.50    Years: 15.00    Types: Cigarettes  . Smokeless  tobacco: Current User  . Alcohol use No    Review of Systems Patient denies headaches, rhinorrhea, blurry vision, numbness, shortness of breath, chest pain, edema, cough, abdominal pain, nausea, vomiting, diarrhea, dysuria, fevers, rashes or hallucinations unless otherwise stated above in HPI. ____________________________________________   PHYSICAL EXAM:  VITAL SIGNS: Vitals:   11/16/16 1841  BP: (!) 108/51  Pulse: 89  Resp: 18  Temp: 99.6 F (37.6 C)  SpO2: 100%    Constitutional: Alert and oriented. Well appearing and in no acute distress. Eyes: Conjunctivae are normal.  Head: Atraumatic. Nose: No congestion/rhinnorhea. Mouth/Throat: Mucous membranes are moist.   Neck: No stridor. Painless ROM.  Cardiovascular: Normal rate, regular rhythm. Grossly normal heart sounds.  Good peripheral circulation. Respiratory: Normal respiratory effort.  No retractions. Lungs CTAB. Gastrointestinal: Soft with mild ttp in RUQ and right flank. Gravid but no uterine ttp. No abdominal bruits. No CVA tenderness. Musculoskeletal: + right foot pain without deformity, no other lower extremity tenderness nor edema.  No joint effusions. Neurologic:  Normal speech and language. No gross focal neurologic deficits are appreciated. No facial droop Skin:  Skin is warm, dry and intact. No rash noted. Psychiatric: Mood and affect are normal. Speech and behavior are normal.  ____________________________________________   LABS (all labs ordered are listed, but only abnormal results are displayed)  Results for orders placed or performed during the hospital encounter of 11/16/16 (from the past 24 hour(s))  Lipase, blood  Status: None   Collection Time: 11/16/16  6:43 PM  Result Value Ref Range   Lipase 20 11 - 51 U/L  Comprehensive metabolic panel     Status: Abnormal   Collection Time: 11/16/16  6:43 PM  Result Value Ref Range   Sodium 133 (L) 135 - 145 mmol/L   Potassium 3.3 (L) 3.5 - 5.1 mmol/L     Chloride 101 101 - 111 mmol/L   CO2 25 22 - 32 mmol/L   Glucose, Bld 116 (H) 65 - 99 mg/dL   BUN 7 6 - 20 mg/dL   Creatinine, Ser 1.610.60 0.44 - 1.00 mg/dL   Calcium 8.8 (L) 8.9 - 10.3 mg/dL   Total Protein 7.5 6.5 - 8.1 g/dL   Albumin 3.4 (L) 3.5 - 5.0 g/dL   AST 12 (L) 15 - 41 U/L   ALT 9 (L) 14 - 54 U/L   Alkaline Phosphatase 47 38 - 126 U/L   Total Bilirubin 0.7 0.3 - 1.2 mg/dL   GFR calc non Af Amer >60 >60 mL/min   GFR calc Af Amer >60 >60 mL/min   Anion gap 7 5 - 15  CBC     Status: Abnormal   Collection Time: 11/16/16  6:43 PM  Result Value Ref Range   WBC 7.8 3.6 - 11.0 K/uL   RBC 3.26 (L) 3.80 - 5.20 MIL/uL   Hemoglobin 10.7 (L) 12.0 - 16.0 g/dL   HCT 09.630.4 (L) 04.535.0 - 40.947.0 %   MCV 93.3 80.0 - 100.0 fL   MCH 32.8 26.0 - 34.0 pg   MCHC 35.2 32.0 - 36.0 g/dL   RDW 81.113.3 91.411.5 - 78.214.5 %   Platelets 208 150 - 440 K/uL  Urinalysis, Complete w Microscopic     Status: Abnormal   Collection Time: 11/16/16  6:43 PM  Result Value Ref Range   Color, Urine YELLOW (A) YELLOW   APPearance CLEAR (A) CLEAR   Specific Gravity, Urine 1.002 (L) 1.005 - 1.030   pH 6.0 5.0 - 8.0   Glucose, UA NEGATIVE NEGATIVE mg/dL   Hgb urine dipstick MODERATE (A) NEGATIVE   Bilirubin Urine NEGATIVE NEGATIVE   Ketones, ur 20 (A) NEGATIVE mg/dL   Protein, ur NEGATIVE NEGATIVE mg/dL   Nitrite NEGATIVE NEGATIVE   Leukocytes, UA TRACE (A) NEGATIVE   RBC / HPF 0-5 0 - 5 RBC/hpf   WBC, UA 0-5 0 - 5 WBC/hpf   Bacteria, UA MANY (A) NONE SEEN   Squamous Epithelial / LPF 0-5 (A) NONE SEEN   Mucus PRESENT   hCG, quantitative, pregnancy     Status: Abnormal   Collection Time: 11/16/16  6:43 PM  Result Value Ref Range   hCG, Beta Chain, Quant, S 38,294 (H) <5 mIU/mL   ____________________________________________  ____________________________________________  RADIOLOGY  I personally reviewed all radiographic images ordered to evaluate for the above acute complaints and reviewed radiology reports and  findings.  These findings were personally discussed with the patient.  Please see medical record for radiology report.  EMERGENCY DEPARTMENT US PREGNANCY "Study: Limited Ultrasound of the Pelvis for Pregnancy"  INDICATIONS:Pregnancy(required) Multiple views of the uterus and pelvic cavity were obtained in real-time with a multi-frequency probe.  APPROACH:Transabdominal  PERFORMED BY: Myself IMAGES ARCHIVED?: Yes LIMITATIONS: none PREGNANCY FREE FLUID: None ADNEXAL FINDINGS: GESTATIONAL AGE, ESTIMATE:  FETAL HEART RATE: 150 INTERPRETATION: reassuring US     ____________________________________________   PROCEDURES  Procedure(s) performed:  Procedures    Critical Care performed: no ____________________________________________  INITIAL IMPRESSION / ASSESSMENT AND PLAN / ED COURSE  Pertinent labs & imaging results that were available during my care of the patient were reviewed by me and considered in my medical decision making (see chart for details).  DDX: hepaititis, stone, pyelo, constipation, pancreatitis, colitis  Kim Kidd is a 31 y.o. who presents to the ED with chief complaint of several days of right upper quadrant and right flank pain associated with nausea and vomiting. Patient is pregnant.Otherwise well appearing. Her abdominal exam does have some mild right flank tenderness to palpation but no peritonitis. Abdomen is gravid but non-tender.No symptoms suggest PID. Blood work is reassuring for hepatitis the patient is already status post appendectomy and cholecystectomy. No evidence of pancreatitis. She does only have one kidney and will order ultrasound evaluate for any evidence of significant hydronephrosis.  Will provide IV fluids.  Clinical Course as of Nov 17 2314  Thu Nov 16, 2016  2246 Repeat abdominal exam is soft and benign.  [place] reassuring.    [PR]  2315 Urinalysis does show evidence of bacteria. She does not have anyother signs of Sirs or  sepsis therefore do think is appropriate to give her a dose of IV Rocephin here in the ER and start the patient on oral Keflex. Patient is tolerating oral hydration and otherwise well-appearing and in no acute distress.  Have discussed with the patient and available family all diagnostics and treatments performed thus far and all questions were answered to the best of my ability. The patient demonstrates understanding and agreement with plan.   [PR]    Clinical Course User Index [PR] Willy Eddy, MD     ____________________________________________   FINAL CLINICAL IMPRESSION(S) / ED DIAGNOSES  Final diagnoses:  RUQ abdominal pain  Non-intractable vomiting with nausea, unspecified vomiting type  Pregnancy, unspecified gestational age  Acute cystitis without hematuria  Acute right ankle pain      NEW MEDICATIONS STARTED DURING THIS VISIT:  New Prescriptions   CEPHALEXIN (KEFLEX) 500 MG CAPSULE    Take 1 capsule (500 mg total) by mouth 3 (three) times daily.   DOXYLAMINE-PYRIDOXINE 10-10 MG TBEC    Take 1 tablet by mouth 2 (two) times daily.     Note:  This document was prepared using Dragon voice recognition software and may include unintentional dictation errors.    Willy Eddy, MD 11/16/16 [phone]

## 2016-11-16 NOTE — Discharge Instructions (Signed)

## 2016-11-16 NOTE — ED Notes (Signed)
Patient transported to Ultrasound 

## 2017-01-24 LAB — OB RESULTS CONSOLE RUBELLA ANTIBODY, IGM: RUBELLA: NON-IMMUNE/NOT IMMUNE

## 2017-01-24 LAB — OB RESULTS CONSOLE RPR: RPR: NONREACTIVE

## 2017-01-24 LAB — OB RESULTS CONSOLE VARICELLA ZOSTER ANTIBODY, IGG: VARICELLA IGG: NON-IMMUNE/NOT IMMUNE

## 2017-01-26 LAB — OB RESULTS CONSOLE HEPATITIS B SURFACE ANTIGEN: Hepatitis B Surface Ag: NEGATIVE

## 2017-01-26 LAB — OB RESULTS CONSOLE HIV ANTIBODY (ROUTINE TESTING): HIV: NONREACTIVE

## 2017-03-30 ENCOUNTER — Other Ambulatory Visit: Payer: Self-pay

## 2017-03-30 ENCOUNTER — Observation Stay
Admission: EM | Admit: 2017-03-30 | Discharge: 2017-03-31 | Disposition: A | Discharge status: Home / Self Care | Payer: Medicaid Other | Attending: Certified Nurse Midwife | Admitting: Certified Nurse Midwife

## 2017-03-30 DIAGNOSIS — Z809 Family history of malignant neoplasm, unspecified: Secondary | ICD-10-CM | POA: Insufficient documentation

## 2017-03-30 DIAGNOSIS — Z8249 Family history of ischemic heart disease and other diseases of the circulatory system: Secondary | ICD-10-CM | POA: Insufficient documentation

## 2017-03-30 DIAGNOSIS — R109 Unspecified abdominal pain: Secondary | ICD-10-CM | POA: Diagnosis present

## 2017-03-30 DIAGNOSIS — R102 Pelvic and perineal pain: Secondary | ICD-10-CM | POA: Insufficient documentation

## 2017-03-30 DIAGNOSIS — Z3A35 35 weeks gestation of pregnancy: Secondary | ICD-10-CM | POA: Insufficient documentation

## 2017-03-30 DIAGNOSIS — F17210 Nicotine dependence, cigarettes, uncomplicated: Secondary | ICD-10-CM | POA: Insufficient documentation

## 2017-03-30 DIAGNOSIS — O26893 Other specified pregnancy related conditions, third trimester: Principal | ICD-10-CM | POA: Insufficient documentation

## 2017-03-30 DIAGNOSIS — Z880 Allergy status to penicillin: Secondary | ICD-10-CM | POA: Insufficient documentation

## 2017-03-30 HISTORY — DX: Personal history of other medical treatment: Z92.89

## 2017-03-30 HISTORY — DX: Personal history of pre-term labor: Z87.51

## 2017-03-30 NOTE — OB Triage Note (Signed)
Pt presents to BP unit via WC in no acute distress, states she has been having menstrual like cramping pain and lower back pain on and off for the past 2-3 days. Says pain has not improved and felt worse tonight. Rates pain 5/10, tries changing position but that did not help relieve pain. Pt confirms +FM. Denies urinary sym, leaking or gush of fluid and no bleeding. [facility] d/t hx one kidney. Was [facility] 2 weeks ago for pelvic pain, cervix 0.5cm.

## 2017-03-31 DIAGNOSIS — R109 Unspecified abdominal pain: Secondary | ICD-10-CM | POA: Diagnosis present

## 2017-03-31 DIAGNOSIS — Z8249 Family history of ischemic heart disease and other diseases of the circulatory system: Secondary | ICD-10-CM | POA: Diagnosis not present

## 2017-03-31 DIAGNOSIS — O26893 Other specified pregnancy related conditions, third trimester: Secondary | ICD-10-CM | POA: Diagnosis not present

## 2017-03-31 DIAGNOSIS — F17210 Nicotine dependence, cigarettes, uncomplicated: Secondary | ICD-10-CM | POA: Diagnosis not present

## 2017-03-31 DIAGNOSIS — Z3A35 35 weeks gestation of pregnancy: Secondary | ICD-10-CM | POA: Diagnosis not present

## 2017-03-31 DIAGNOSIS — R102 Pelvic and perineal pain: Secondary | ICD-10-CM | POA: Diagnosis not present

## 2017-03-31 DIAGNOSIS — Z880 Allergy status to penicillin: Secondary | ICD-10-CM | POA: Diagnosis not present

## 2017-03-31 DIAGNOSIS — Z809 Family history of malignant neoplasm, unspecified: Secondary | ICD-10-CM | POA: Diagnosis not present

## 2017-03-31 NOTE — Discharge Summary (Signed)
Kim Kidd is a 32 y.o. female. She is at 4451w2d gestation. Patient's last menstrual period was 06/01/2016 (approximate). Estimated Date of Delivery: 05/03/17  [facility]  Chief complaint: abdominal pain/cramping  Location: b/l LQ and groin Onset/timing: 2 days ago Duration: intermittent Quality: cramping, shooting "like a lighting bolt" Severity: mild to moderate Aggravating or alleviating conditions: warm showers, Tylenol Associated signs/symptoms: none; no dysuria, no urinary frequency or urgency, no change to vaginal discharge Context: Kim Kidd reports bilateral lower abdominal cramping that goes from her groins and wraps around her belly. This pain started about two days ago and comes and goes, mostly feeling like a cramping pain, but sometimes a shooting pain. She has noticed some improvement with repositioning, Tylenol, and warm showers.   S: Resting comfortably. No contractions, no VB, no LOF. Active fetal movement.   Maternal Medical History:   Past Medical History:  Diagnosis Date  . History of blood transfusion   . History of preterm labor     Past Surgical History:  Procedure Laterality Date  . APPENDECTOMY    . CHOLECYSTECTOMY    . NEPHRECTOMY Left     Allergies  Allergen Reactions  . Penicillins Hives    Prior to Admission medications   Medication Sig Start Date End Date Taking? Authorizing Provider  amoxicillin (AMOXIL) 500 MG tablet Take 1 tablet (500 mg total) by mouth 3 (three) times daily. Patient not taking: Reported on 03/30/2017 05/01/16   Kem Boroughsriplett, Cari B, FNP  Doxylamine-Pyridoxine 10-10 MG TBEC Take 1 tablet by mouth 2 (two) times daily. Patient not taking: Reported on 03/30/2017 11/16/16   Willy Eddyobinson, Patrick, MD  naproxen (NAPROSYN) 500 MG tablet Take 1 tablet (500 mg total) by mouth 2 (two) times daily with a meal. Patient not taking: Reported on 03/30/2017 05/01/16   Chinita Pesterriplett, Cari B, FNP     Social History: She  reports that  she has been smoking cigarettes.  She has a 7.50 pack-year smoking history. She uses smokeless tobacco. She reports that she does not drink alcohol or use drugs.  Family History: family history includes Cancer in her paternal grandfather and paternal grandmother; Hypertension in her maternal grandmother.   Review of Systems: A full review of systems was performed and negative except as noted in the HPI.    O:  Ht 5\' 7"  (1.702 m)   Wt 72.6 kg (160 lb)   LMP 06/01/2016 (Approximate)   BMI 25.06 kg/m  No results found for this or any previous visit (from the past 48 hour(s)).   Constitutional: NAD, AAOx3  HE/ENT: extraocular movements grossly intact, moist mucous membranes CV: RRR PULM: nl respiratory effort, CTABL     Abd: gravid, non-tender, non-distended, soft      Ext: Non-tender, +1 LE edema, non-pitting   Psych: mood appropriate, speech normal Pelvic: 1/50/-2 per RN exam, mild labial edema  NST:  Baseline: 135bpm Variability: moderate Accelerations: 15x15 present x >2 Decelerations: absent Time: 1h 15mins   A/P: 32 y.o. 7551w2d here for antenatal surveillance for abdominal pain  Labor: not present.   Fetal Wellbeing: Reassuring Cat 1 tracing.  Reactive NST   Round ligament pain: reviewed Tylenol PRN, hydration, and pregnancy support band.   D/c home stable, precautions reviewed, follow-up as scheduled.   Kim Kidd 03/31/2017 12:48 AM  ----- Kim Kidd, CNM Certified Nurse Midwife [facility], Department of OB/GYN [facility]

## 2017-03-31 NOTE — Discharge Summary (Signed)
Patient discharged with instructions on follow up appointment, pain management during pregnancy, labor precautions, and when to seek medical attention. Patient ambulatory at discharge with steady gait with no complaints. Patient reports +FM at discharge. Patient discharged with significant other.

## 2017-03-31 NOTE — Discharge Instructions (Signed)
Instructions for Patients & Families  °Preterm Labor  ° ° °What is Preterm Labor?  °Preterm labor is labor that happens before 37 weeks of pregnancy. Preterm labor involves regular contractions and cervical change. Cervical change is opening (dilating) and thinning out (effacing) of the cervix. It is not possible to tell which contractions will change the cervix. If you have frequent, regular contractions before 37 weeks of pregnancy, you need to contact your provider.  ° °Why is Preterm Labor a problem?  °Babies that are born before 37 weeks have many different types of problems, depending on how young or small they are. While many premature babies will survive, they may have problems breathing, fighting off infection, keeping a normal body temperature, and eating.  °Preterm baby's health problems may continue for a long time, even years. You may know someone who has had a preterm baby or have a previous child who was born early. That is fine, but that does not guarantee that the child you are carrying now will be okay. It also does not guarantee that a baby who does fine after birth will not have problems when they become school age.  ° °Who may have Preterm Labor?  °Anyone can have preterm labor but certain people are at a higher risk.  °· Previous premature labor or preterm birth  °· Infections  °· Oddly shaped uterus  °· More than one baby (Twins, triplets, etc.)  °· Weak cervix  °· Too much fluid around the baby  °· Premature rupture of membranes (water bag breaks early)  °· Large fibroid in the uterus  °· Cocaine use  ° °What are the signs of Preterm Labor?  °One or more of the following signs may mean that preterm labor is starting. If you feel any of these prior to 37 weeks contact your provider immediately.  °· Contractions- Contractions, or tightening of the uterus (womb), can happen sometimes throughout the pregnancy but is it not normal to contract frequently and regularly (every 15 minutes or closer)  before 37 weeks of pregnancy. Preterm contractions may be painless, but the uterus will feel hard all over.  °· Menstrual-type Cramps- Usually felt very low in the abdomen, these cramps may come and go or may be constant.  °· Lower Backache-This is a dull pain at the bottom of your spine, which may move to you sides or belly. It may come and go or may be constant.  °· Pressure- Pressure is felt in the vagina, as if the baby were going to fall out.  °· Intestinal Cramps- This can feel like gas pains, and diarrhea may also happen.  °· Change in Vaginal Discharge-Your discharge may become heavier or watery, and may be tinged with blood.  ° °What will happen next?  °When you call, your provider may tell you to lie down and feel for the contractions for an hour while you drink some water. You feel for contractions by lying on your left side and putting your fingertips on your abdomen. Your uterus will feel tight and firm (like your forehead) when you are having a contraction and soft (like the tip or your nose) when you are not contracting. Time your contractions from the beginning of one to the beginning of the next for an hour. If you are having contractions or tightening every fifteen minutes or closer, come to the hospital.  ° °It is very important that you don't ignore symptoms of preterm labor  °Our best chance to treat preterm   labor is to treat early.   If you have any further questions, please contact your provider  (Doctor, nurse practitioner or nurse midwife).   Call your provider or come back to the hospital if you have any of the following:   4 or more contractions in one hour before 37 weeks of pregnancy   Leakage of fluid or you think your water breaks   Bright red bleeding from your vagina   Fever greater than 100.4   Decrease in your babys normal movements   If you feel a decrease in your babys normal movements and are greater than 28 weeks you should do kick counts. You should count  your babys movements at least once a day for 2 hours while lying down on your side. You should feel at least 10 movements in this time period

## 2017-04-13 ENCOUNTER — Other Ambulatory Visit: Payer: Self-pay

## 2017-04-13 ENCOUNTER — Observation Stay
Admission: EM | Admit: 2017-04-13 | Discharge: 2017-04-13 | Disposition: A | Discharge status: Home / Self Care | Payer: Medicaid Other | Attending: Obstetrics and Gynecology | Admitting: Obstetrics and Gynecology

## 2017-04-13 ENCOUNTER — Telehealth: Payer: Self-pay

## 2017-04-13 DIAGNOSIS — O4700 False labor before 37 completed weeks of gestation, unspecified trimester: Secondary | ICD-10-CM

## 2017-04-13 DIAGNOSIS — Z880 Allergy status to penicillin: Secondary | ICD-10-CM | POA: Diagnosis not present

## 2017-04-13 DIAGNOSIS — Z3A37 37 weeks gestation of pregnancy: Secondary | ICD-10-CM | POA: Insufficient documentation

## 2017-04-13 DIAGNOSIS — O471 False labor at or after 37 completed weeks of gestation: Secondary | ICD-10-CM | POA: Diagnosis not present

## 2017-04-13 DIAGNOSIS — O99333 Smoking (tobacco) complicating pregnancy, third trimester: Secondary | ICD-10-CM | POA: Insufficient documentation

## 2017-04-13 DIAGNOSIS — O479 False labor, unspecified: Secondary | ICD-10-CM

## 2017-04-13 DIAGNOSIS — F17210 Nicotine dependence, cigarettes, uncomplicated: Secondary | ICD-10-CM | POA: Insufficient documentation

## 2017-04-13 LAB — URINE DRUG SCREEN, QUALITATIVE (ARMC ONLY)
Amphetamines, Ur Screen: NOT DETECTED
BARBITURATES, UR SCREEN: NOT DETECTED
BENZODIAZEPINE, UR SCRN: NOT DETECTED
CANNABINOID 50 NG, UR ~~LOC~~: POSITIVE — AB
COCAINE METABOLITE, UR ~~LOC~~: NOT DETECTED
MDMA (Ecstasy)Ur Screen: NOT DETECTED
METHADONE SCREEN, URINE: NOT DETECTED
OPIATE, UR SCREEN: NOT DETECTED
Phencyclidine (PCP) Ur S: NOT DETECTED
TRICYCLIC, UR SCREEN: NOT DETECTED

## 2017-04-13 LAB — CHLAMYDIA/NGC RT PCR (ARMC ONLY)
Chlamydia Tr: NOT DETECTED
N gonorrhoeae: NOT DETECTED

## 2017-04-13 LAB — OB RESULTS CONSOLE GBS: STREP GROUP B AG: POSITIVE

## 2017-04-13 LAB — OB RESULTS CONSOLE GC/CHLAMYDIA
Chlamydia: NEGATIVE
GC PROBE AMP, GENITAL: NEGATIVE

## 2017-04-13 MED ORDER — ACETAMINOPHEN 325 MG PO TABS: 650.0000 mg | ORAL_TABLET | ORAL | Status: DC | PRN

## 2017-04-13 MED ORDER — LACTATED RINGERS IV SOLN: 500.0000 mL | INTRAVENOUS | Status: DC | PRN

## 2017-04-13 NOTE — OB Triage Note (Signed)
Pt states that she is having constant pressure in her lower abdomen with the urge to have a bowel movement. These symptoms started yesterday, she reports still feeling the baby move. Denies any vaginal bleeding or leaking fluid.

## 2017-04-13 NOTE — Progress Notes (Signed)
Kim Kidd is a 32 y.o. female. She is at 948w1d gestation. Patient's last menstrual period was 06/01/2016 (approximate). Estimated Date of Delivery: 05/03/17  [facility] care site: [facility]- last seen about 1 month ago.   Current pregnancy complicated by: Substance abuse d/o- Methadone  Chief complaint: abdominal and pelvic pressure Denies UCs, has constipation- with last BM yesterday.    S: Resting comfortably. no CTX, no VB.no LOF,  Active fetal movement.  Denies: HA, visual changes, SOB, or RUQ/epigastric pain  Maternal Medical History:   Past Medical History:  Diagnosis Date  . History of blood transfusion   . History of preterm labor     Past Surgical History:  Procedure Laterality Date  . APPENDECTOMY    . CHOLECYSTECTOMY    . NEPHRECTOMY Left     Allergies  Allergen Reactions  . Penicillins Hives    Prior to Admission medications   Not on File      Social History: She  reports that she has been smoking cigarettes.  She has a 7.50 pack-year smoking history. She uses smokeless tobacco. She reports that she does not drink alcohol or use drugs.  Family History: family history includes Cancer in her paternal grandfather and paternal grandmother; Hypertension in her maternal grandmother.   Review of Systems: A full review of systems was performed and negative except as noted in the HPI.     O:  LMP 06/01/2016 (Approximate)  No results found for this or any previous visit (from the past 48 hour(s)).   Constitutional: NAD, AAOx3  HE/ENT: extraocular movements grossly intact, moist mucous membranes CV: RRR PULM: nl respiratory effort, CTABL     Abd: gravid, non-tender, non-distended, soft      Ext: Non-tender, Nonedematous   Psych: mood appropriate, speech normal Pelvic: SVE per RN, 1/thk/ballottable  Fetal  monitoring: Cat 1, Appropriate for GA Baseline: 125 Variability: moderate Accelerations:  present x >2 Decelerations absent  Toco: rare  UCs    A/P: 32 y.o. 698w1d here for antenatal surveillance for pelvic and abdominal pressure  Labor: not present.   Fetal Wellbeing: Reassuring Cat 1 tracing.  Reactive NST   D/c home stable, precautions reviewed, [facility]   McVey, REBECCA A, CNM 04/13/17 4:56 PM

## 2017-04-13 NOTE — Discharge Summary (Signed)
Patient ID: Kim Kidd MRN: [id number] DOB/AGE: [birth year] 32 y.o.  Admit date: 04/13/2017 Discharge date: 04/13/2017  Admission Diagnoses: abdominal pain, pelvic pressure  Discharge Diagnoses: false labor after 37wks  Prenatal Procedures: NST  Consults: Neonatology, Maternal Fetal Medicine  Significant Diagnostic Studies:  No results found for this or any previous visit (from the past 168 hour(s)).  Treatments: none, observation, continuous fetal monitoring  Hospital Course:  This is a 32 y.o. [id number] with IUP at 3251w1d admitted for suspected labor, noted to have a cervical exam of 1/thick/ballottable.  No leaking of fluid and no bleeding.  She was observed, fetal heart rate monitoring remained reassuring, and she had no signs/symptoms of progressing  labor or other maternal-fetal concerns.  Her cervical exam was unchanged from admission.  She was deemed stable for discharge to home with outpatient follow up.  Discharge Physical Exam:  LMP 06/01/2016 (Approximate)   General: NAD CV: RRR Pulm: CTABL, nl effort ABD: s/nd/nt, gravid DVT Evaluation: LE non-ttp, no evidence of DVT on exam.  SVE: 1/50/-3; soft/mid FHT: 120bpm, mod variability, + accels, no decels.  TOCO: rare UCs   Discharge Condition: Stable  Disposition: 01-Home or Self Care  Discharge Instructions    Discharge diet:  No restrictions   Complete by:  As directed    LABOR:  When conractions begin, you should start to time them from the beginning of one contraction to the beginning  of the next.  When contractions are 5 - 10 minutes apart or less and have been regular for at least an hour, you should call your health care provider.   Complete by:  As directed    No sexual activity restrictions   Complete by:  As directed    Notify physician for bleeding from the vagina   Complete by:  As directed    Notify physician for blurring of vision or spots before the eyes   Complete by:  As directed    Notify  physician for chills or fever   Complete by:  As directed    Notify physician for fainting spells, "black outs" or loss of consciousness   Complete by:  As directed    Notify physician for increase in vaginal discharge   Complete by:  As directed    Notify physician for leaking of fluid   Complete by:  As directed    Notify physician for pain or burning when urinating   Complete by:  As directed    Notify physician for pelvic pressure (sudden increase)   Complete by:  As directed    Notify physician for severe or continued nausea or vomiting   Complete by:  As directed    Notify physician for sudden gushing of fluid from the vagina (with or without continued leaking)   Complete by:  As directed    Notify physician for sudden, constant, or occasional abdominal pain   Complete by:  As directed    Notify physician if baby moving less than usual   Complete by:  As directed      Allergies as of 04/13/2017      Reactions   Penicillins Hives      Medication List    You have not been prescribed any medications.    Follow-up Information    [place], [facility]. Schedule an appointment as soon as possible for a visit.   Contact information: [facility] [address] [place] [id number] [phone]  Signed: ----- Kiran Carline A, CNM 04/13/17  5:19 PM

## 2017-04-18 LAB — CULTURE, BETA STREP (GROUP B ONLY)

## 2017-05-01 ENCOUNTER — Other Ambulatory Visit: Payer: Self-pay

## 2017-05-01 ENCOUNTER — Inpatient Hospital Stay
Admission: EM | Admit: 2017-05-01 | Discharge: 2017-05-05 | DRG: 786 | Disposition: A | Discharge status: Home / Self Care | Payer: Medicaid Other | Attending: Obstetrics and Gynecology | Admitting: Obstetrics and Gynecology

## 2017-05-01 DIAGNOSIS — Z9889 Other specified postprocedural states: Secondary | ICD-10-CM

## 2017-05-01 DIAGNOSIS — Z880 Allergy status to penicillin: Secondary | ICD-10-CM

## 2017-05-01 DIAGNOSIS — O99892 Other specified diseases and conditions complicating childbirth: Secondary | ICD-10-CM

## 2017-05-01 DIAGNOSIS — F17210 Nicotine dependence, cigarettes, uncomplicated: Secondary | ICD-10-CM | POA: Diagnosis present

## 2017-05-01 DIAGNOSIS — O99824 Streptococcus B carrier state complicating childbirth: Secondary | ICD-10-CM | POA: Diagnosis present

## 2017-05-01 DIAGNOSIS — D62 Acute posthemorrhagic anemia: Secondary | ICD-10-CM | POA: Diagnosis not present

## 2017-05-01 DIAGNOSIS — O99324 Drug use complicating childbirth: Principal | ICD-10-CM | POA: Diagnosis present

## 2017-05-01 DIAGNOSIS — O479 False labor, unspecified: Secondary | ICD-10-CM | POA: Diagnosis present

## 2017-05-01 DIAGNOSIS — O411230 Chorioamnionitis, third trimester, not applicable or unspecified: Secondary | ICD-10-CM | POA: Diagnosis present

## 2017-05-01 DIAGNOSIS — Z3A39 39 weeks gestation of pregnancy: Secondary | ICD-10-CM

## 2017-05-01 DIAGNOSIS — O9081 Anemia of the puerperium: Secondary | ICD-10-CM | POA: Diagnosis not present

## 2017-05-01 DIAGNOSIS — Z98891 History of uterine scar from previous surgery: Secondary | ICD-10-CM

## 2017-05-01 DIAGNOSIS — O99334 Smoking (tobacco) complicating childbirth: Secondary | ICD-10-CM | POA: Diagnosis present

## 2017-05-01 DIAGNOSIS — F1290 Cannabis use, unspecified, uncomplicated: Secondary | ICD-10-CM | POA: Diagnosis present

## 2017-05-01 DIAGNOSIS — Z3483 Encounter for supervision of other normal pregnancy, third trimester: Secondary | ICD-10-CM | POA: Diagnosis present

## 2017-05-01 LAB — CBC
HCT: 33 % — ABNORMAL LOW (ref 35.0–47.0)
HEMOGLOBIN: 11.6 g/dL — AB (ref 12.0–16.0)
MCH: 33.2 pg (ref 26.0–34.0)
MCHC: 35.1 g/dL (ref 32.0–36.0)
MCV: 94.7 fL (ref 80.0–100.0)
Platelets: 255 10*3/uL (ref 150–440)
RBC: 3.48 MIL/uL — AB (ref 3.80–5.20)
RDW: 12.6 % (ref 11.5–14.5)
WBC: 8.4 10*3/uL (ref 3.6–11.0)

## 2017-05-01 MED ORDER — ACETAMINOPHEN 325 MG PO TABS: 650.0000 mg | ORAL_TABLET | ORAL | Status: DC | PRN

## 2017-05-01 MED ORDER — BUTORPHANOL TARTRATE 1 MG/ML IJ SOLN
1.0000 mg | INTRAMUSCULAR | Status: DC | PRN
  Administered 2017-05-01: 1 mg via INTRAVENOUS
  Filled 2017-05-01: qty 1

## 2017-05-01 MED ORDER — LIDOCAINE HCL (PF) 1 % IJ SOLN
30.0000 mL | INTRAMUSCULAR | Status: AC | PRN
  Administered 2017-05-02: 4 mL via SUBCUTANEOUS

## 2017-05-01 MED ORDER — LACTATED RINGERS IV SOLN
INTRAVENOUS | Status: DC
  Administered 2017-05-01 – 2017-05-02 (×2): via INTRAVENOUS

## 2017-05-01 MED ORDER — CLINDAMYCIN PHOSPHATE 900 MG/50ML IV SOLN
900.0000 mg | Freq: Three times a day (TID) | INTRAVENOUS | Status: DC
  Administered 2017-05-01: 900 mg via INTRAVENOUS
  Filled 2017-05-01: qty 50

## 2017-05-01 MED ORDER — SOD CITRATE-CITRIC ACID 500-334 MG/5ML PO SOLN: 30.0000 mL | ORAL | Status: DC | PRN

## 2017-05-01 MED ORDER — LACTATED RINGERS IV SOLN: 500.0000 mL | INTRAVENOUS | Status: DC | PRN

## 2017-05-01 MED ORDER — ONDANSETRON HCL 4 MG/2ML IJ SOLN
4.0000 mg | Freq: Four times a day (QID) | INTRAMUSCULAR | Status: DC | PRN
  Administered 2017-05-02: 4 mg via INTRAVENOUS

## 2017-05-01 NOTE — OB Triage Note (Signed)
Pt arrived to triage with c/o contractions on and off all day starting at 1030 this am.  Denies vaginal bleeding, LOF and is feeling baby move normally.  EFM and toco applied and assessing.

## 2017-05-02 ENCOUNTER — Inpatient Hospital Stay: Payer: Medicaid Other

## 2017-05-02 ENCOUNTER — Inpatient Hospital Stay: Payer: Medicaid Other | Admitting: Anesthesiology

## 2017-05-02 ENCOUNTER — Encounter
Admission: EM | Disposition: A | Discharge status: Home / Self Care | Payer: Self-pay | Source: Home / Self Care | Attending: Obstetrics and Gynecology

## 2017-05-02 ENCOUNTER — Encounter: Payer: Self-pay | Admitting: Anesthesiology

## 2017-05-02 DIAGNOSIS — Z9889 Other specified postprocedural states: Secondary | ICD-10-CM

## 2017-05-02 HISTORY — PX: BLADDER REPAIR: SHX6721

## 2017-05-02 LAB — CBC
HCT: 22 % — ABNORMAL LOW (ref 35.0–47.0)
HEMATOCRIT: 23.9 % — AB (ref 35.0–47.0)
HEMOGLOBIN: 8.2 g/dL — AB (ref 12.0–16.0)
Hemoglobin: 7.5 g/dL — ABNORMAL LOW (ref 12.0–16.0)
MCH: 32 pg (ref 26.0–34.0)
MCH: 31.6 pg (ref 26.0–34.0)
MCHC: 33.9 g/dL (ref 32.0–36.0)
MCHC: 34.4 g/dL (ref 32.0–36.0)
MCV: 94.4 fL (ref 80.0–100.0)
MCV: 91.8 fL (ref 80.0–100.0)
PLATELETS: 269 10*3/uL (ref 150–440)
Platelets: 191 10*3/uL (ref 150–440)
RBC: 2.33 MIL/uL — AB (ref 3.80–5.20)
RBC: 2.6 MIL/uL — ABNORMAL LOW (ref 3.80–5.20)
RDW: 12.6 % (ref 11.5–14.5)
RDW: 14.3 % (ref 11.5–14.5)
WBC: 15.8 10*3/uL — ABNORMAL HIGH (ref 3.6–11.0)
WBC: 9.7 10*3/uL (ref 3.6–11.0)

## 2017-05-02 LAB — URINE DRUG SCREEN, QUALITATIVE (ARMC ONLY)
Amphetamines, Ur Screen: NOT DETECTED
BARBITURATES, UR SCREEN: NOT DETECTED
Benzodiazepine, Ur Scrn: NOT DETECTED
CANNABINOID 50 NG, UR ~~LOC~~: POSITIVE — AB
Cocaine Metabolite,Ur ~~LOC~~: NOT DETECTED
MDMA (ECSTASY) UR SCREEN: NOT DETECTED
Methadone Scn, Ur: NOT DETECTED
Opiate, Ur Screen: NOT DETECTED
PHENCYCLIDINE (PCP) UR S: NOT DETECTED
Tricyclic, Ur Screen: NOT DETECTED

## 2017-05-02 LAB — GLUCOSE, CAPILLARY: GLUCOSE-CAPILLARY: 141 mg/dL — AB (ref 65–99)

## 2017-05-02 LAB — KLEIHAUER-BETKE STAIN
FETAL CELLS %: 0 %
QUANTITATION FETAL HEMOGLOBIN: 0 mL

## 2017-05-02 LAB — PREPARE RBC (CROSSMATCH)

## 2017-05-02 LAB — HEMOGLOBIN AND HEMATOCRIT, BLOOD
HEMATOCRIT: 24 % — AB (ref 35.0–47.0)
Hemoglobin: 8 g/dL — ABNORMAL LOW (ref 12.0–16.0)

## 2017-05-02 SURGERY — Surgical Case
Anesthesia: Epidural | Site: Bladder

## 2017-05-02 MED ORDER — METHYLENE BLUE 1 % INJ SOLN
INTRAMUSCULAR | Status: AC
  Filled 2017-05-02: qty 10

## 2017-05-02 MED ORDER — LACTATED RINGERS IV SOLN
INTRAVENOUS | Status: DC | PRN
  Administered 2017-05-02: 09:00:00 via INTRAVENOUS

## 2017-05-02 MED ORDER — HYDROMORPHONE HCL 1 MG/ML IJ SOLN
INTRAMUSCULAR | Status: AC
  Administered 2017-05-02: 0.5 mg via INTRAVENOUS
  Filled 2017-05-02: qty 1

## 2017-05-02 MED ORDER — FENTANYL CITRATE (PF) 100 MCG/2ML IJ SOLN
INTRAMUSCULAR | Status: AC
  Administered 2017-05-02: 25 ug via INTRAVENOUS
  Filled 2017-05-02: qty 2

## 2017-05-02 MED ORDER — PROPOFOL 10 MG/ML IV BOLUS
INTRAVENOUS | Status: DC | PRN
  Administered 2017-05-02: 150 mg via INTRAVENOUS

## 2017-05-02 MED ORDER — BUPIVACAINE HCL (PF) 0.5 % IJ SOLN
INTRAMUSCULAR | Status: DC | PRN
  Administered 2017-05-02 (×2): 5 mL via PERINEURAL

## 2017-05-02 MED ORDER — FENTANYL 2.5 MCG/ML W/ROPIVACAINE 0.15% IN NS 100 ML EPIDURAL (ARMC)
EPIDURAL | Status: AC
  Filled 2017-05-02: qty 100

## 2017-05-02 MED ORDER — BUPIVACAINE HCL (PF) 0.5 % IJ SOLN
INTRAMUSCULAR | Status: AC
  Filled 2017-05-02: qty 10

## 2017-05-02 MED ORDER — OXYTOCIN 10 UNIT/ML IJ SOLN
INTRAMUSCULAR | Status: AC
  Filled 2017-05-02: qty 2

## 2017-05-02 MED ORDER — LIDOCAINE HCL (PF) 2 % IJ SOLN
INTRAMUSCULAR | Status: DC | PRN
  Administered 2017-05-02: 3 mL via EPIDURAL

## 2017-05-02 MED ORDER — ONDANSETRON HCL 4 MG/2ML IJ SOLN: 4.0000 mg | Freq: Once | INTRAMUSCULAR | Status: DC | PRN

## 2017-05-02 MED ORDER — FENTANYL CITRATE (PF) 100 MCG/2ML IJ SOLN
25.0000 ug | INTRAMUSCULAR | Status: DC | PRN
  Administered 2017-05-02 (×2): 25 ug via INTRAVENOUS

## 2017-05-02 MED ORDER — IBUPROFEN 600 MG PO TABS
600.0000 mg | ORAL_TABLET | Freq: Four times a day (QID) | ORAL | Status: DC
  Administered 2017-05-02: 600 mg via ORAL
  Filled 2017-05-02: qty 1

## 2017-05-02 MED ORDER — DIPHENHYDRAMINE HCL 50 MG/ML IJ SOLN: 12.5000 mg | Freq: Four times a day (QID) | INTRAMUSCULAR | Status: DC | PRN

## 2017-05-02 MED ORDER — AMMONIA AROMATIC IN INHA
RESPIRATORY_TRACT | Status: AC
  Filled 2017-05-02: qty 10

## 2017-05-02 MED ORDER — SODIUM CHLORIDE 0.9% FLUSH: 9.0000 mL | INTRAVENOUS | Status: DC | PRN

## 2017-05-02 MED ORDER — PHENYLEPHRINE 40 MCG/ML (10ML) SYRINGE FOR IV PUSH (FOR BLOOD PRESSURE SUPPORT): 80.0000 ug | PREFILLED_SYRINGE | INTRAVENOUS | Status: DC | PRN

## 2017-05-02 MED ORDER — OXYTOCIN 40 UNITS IN LACTATED RINGERS INFUSION - SIMPLE MED
INTRAVENOUS | Status: DC | PRN
  Administered 2017-05-02 (×3): 500 mL via INTRAVENOUS

## 2017-05-02 MED ORDER — BUPIVACAINE LIPOSOME 1.3 % IJ SUSP
INTRAMUSCULAR | Status: DC | PRN
  Administered 2017-05-02 (×2): 10 mL via PERINEURAL

## 2017-05-02 MED ORDER — NEOSTIGMINE METHYLSULFATE 10 MG/10ML IV SOLN
INTRAVENOUS | Status: AC
  Filled 2017-05-02: qty 1

## 2017-05-02 MED ORDER — MENTHOL 3 MG MT LOZG
1.0000 | LOZENGE | OROMUCOSAL | Status: DC | PRN
  Administered 2017-05-03: 3 mg via ORAL
  Filled 2017-05-02: qty 9

## 2017-05-02 MED ORDER — MIDAZOLAM HCL 2 MG/2ML IJ SOLN
INTRAMUSCULAR | Status: DC | PRN
  Administered 2017-05-02: 2 mg via INTRAVENOUS

## 2017-05-02 MED ORDER — SENNOSIDES-DOCUSATE SODIUM 8.6-50 MG PO TABS
2.0000 | ORAL_TABLET | ORAL | Status: DC
  Administered 2017-05-03 – 2017-05-05 (×3): 2 via ORAL
  Filled 2017-05-02 (×3): qty 2

## 2017-05-02 MED ORDER — EPHEDRINE SULFATE 50 MG/ML IJ SOLN
5.0000 mg | Freq: Once | INTRAMUSCULAR | Status: AC
  Administered 2017-05-02: 5 mg via INTRAVENOUS

## 2017-05-02 MED ORDER — BUPIVACAINE HCL (PF) 0.25 % IJ SOLN
INTRAMUSCULAR | Status: DC | PRN
  Administered 2017-05-02 (×2): 4 mL via EPIDURAL

## 2017-05-02 MED ORDER — MISOPROSTOL 200 MCG PO TABS
ORAL_TABLET | ORAL | Status: AC
  Filled 2017-05-02: qty 4

## 2017-05-02 MED ORDER — LIDOCAINE HCL (PF) 1 % IJ SOLN
INTRAMUSCULAR | Status: DC | PRN
  Administered 2017-05-02: 3 mL
  Administered 2017-05-02: 2 mL

## 2017-05-02 MED ORDER — LIDOCAINE HCL (PF) 1 % IJ SOLN
INTRAMUSCULAR | Status: AC
  Filled 2017-05-02: qty 5

## 2017-05-02 MED ORDER — DIPHENHYDRAMINE HCL 25 MG PO CAPS: 25.0000 mg | ORAL_CAPSULE | Freq: Four times a day (QID) | ORAL | Status: DC | PRN

## 2017-05-02 MED ORDER — PHENYLEPHRINE HCL 10 MG/ML IJ SOLN
INTRAMUSCULAR | Status: DC | PRN
  Administered 2017-05-02: 50 ug/min via INTRAVENOUS

## 2017-05-02 MED ORDER — ACETAMINOPHEN 325 MG PO TABS
650.0000 mg | ORAL_TABLET | ORAL | Status: DC | PRN
  Administered 2017-05-03 – 2017-05-05 (×11): 650 mg via ORAL
  Filled 2017-05-02 (×11): qty 2

## 2017-05-02 MED ORDER — OXYTOCIN 40 UNITS IN LACTATED RINGERS INFUSION - SIMPLE MED
INTRAVENOUS | Status: AC
  Filled 2017-05-02: qty 1000

## 2017-05-02 MED ORDER — EPHEDRINE SULFATE 50 MG/ML IJ SOLN
INTRAMUSCULAR | Status: DC | PRN
  Administered 2017-05-02: 15 mg via INTRAVENOUS

## 2017-05-02 MED ORDER — LACTATED RINGERS IV BOLUS (SEPSIS)
250.0000 mL | Freq: Once | INTRAVENOUS | Status: AC
  Administered 2017-05-02: 250 mL via INTRAVENOUS

## 2017-05-02 MED ORDER — EPHEDRINE SULFATE 50 MG/ML IJ SOLN
INTRAMUSCULAR | Status: AC
  Administered 2017-05-02: 10:00:00
  Filled 2017-05-02: qty 1

## 2017-05-02 MED ORDER — FENTANYL CITRATE (PF) 100 MCG/2ML IJ SOLN
INTRAMUSCULAR | Status: AC
  Filled 2017-05-02: qty 2

## 2017-05-02 MED ORDER — FENTANYL CITRATE (PF) 100 MCG/2ML IJ SOLN
25.0000 ug | INTRAMUSCULAR | Status: DC | PRN
  Administered 2017-05-02 (×4): 25 ug via INTRAVENOUS

## 2017-05-02 MED ORDER — GLYCOPYRROLATE 0.2 MG/ML IJ SOLN
INTRAMUSCULAR | Status: AC
  Filled 2017-05-02: qty 3

## 2017-05-02 MED ORDER — DIPHENHYDRAMINE HCL 12.5 MG/5ML PO ELIX
12.5000 mg | ORAL_SOLUTION | Freq: Four times a day (QID) | ORAL | Status: DC | PRN
  Filled 2017-05-02: qty 5

## 2017-05-02 MED ORDER — SIMETHICONE 80 MG PO CHEW
80.0000 mg | CHEWABLE_TABLET | ORAL | Status: DC
  Administered 2017-05-05: 80 mg via ORAL
  Filled 2017-05-02 (×2): qty 1

## 2017-05-02 MED ORDER — PHENYLEPHRINE HCL 10 MG/ML IJ SOLN
INTRAMUSCULAR | Status: DC | PRN
  Administered 2017-05-02: 200 ug via INTRAVENOUS
  Administered 2017-05-02: 400 ug via INTRAVENOUS
  Administered 2017-05-02: 100 ug via INTRAVENOUS
  Administered 2017-05-02 (×3): 300 ug via INTRAVENOUS

## 2017-05-02 MED ORDER — TERBUTALINE SULFATE 1 MG/ML IJ SOLN
0.2500 mg | Freq: Once | INTRAMUSCULAR | Status: AC
  Administered 2017-05-02: 0.25 mg via SUBCUTANEOUS

## 2017-05-02 MED ORDER — SODIUM CHLORIDE 0.9 % IV SOLN: Freq: Once | INTRAVENOUS | Status: DC

## 2017-05-02 MED ORDER — DIPHENHYDRAMINE HCL 50 MG/ML IJ SOLN
25.0000 mg | Freq: Once | INTRAMUSCULAR | Status: AC
  Administered 2017-05-02: 25 mg via INTRAVENOUS

## 2017-05-02 MED ORDER — SUCCINYLCHOLINE CHLORIDE 20 MG/ML IJ SOLN
INTRAMUSCULAR | Status: DC | PRN
  Administered 2017-05-02: 140 mg via INTRAVENOUS

## 2017-05-02 MED ORDER — ROCURONIUM BROMIDE 100 MG/10ML IV SOLN
INTRAVENOUS | Status: DC | PRN
  Administered 2017-05-02: 10 mg via INTRAVENOUS
  Administered 2017-05-02: 50 mg via INTRAVENOUS

## 2017-05-02 MED ORDER — LACTATED RINGERS IV SOLN: 500.0000 mL | Freq: Once | INTRAVENOUS | Status: DC

## 2017-05-02 MED ORDER — MORPHINE SULFATE 2 MG/ML IV SOLN
INTRAVENOUS | Status: DC
  Administered 2017-05-02: 15:00:00 via INTRAVENOUS
  Administered 2017-05-02: 8.53 mg via INTRAVENOUS
  Administered 2017-05-02: 23:00:00 via INTRAVENOUS
  Administered 2017-05-02: 17.44 mg via INTRAVENOUS
  Administered 2017-05-02: 6.08 mg via INTRAVENOUS
  Administered 2017-05-03: 14.9 mg via INTRAVENOUS
  Administered 2017-05-03: 9.91 mg via INTRAVENOUS
  Administered 2017-05-03: 15.96 mg via INTRAVENOUS
  Administered 2017-05-03: 12:00:00 via INTRAVENOUS
  Administered 2017-05-03: 10.7 mg via INTRAVENOUS
  Administered 2017-05-03: 11.12 mg via INTRAVENOUS
  Filled 2017-05-02 (×3): qty 30

## 2017-05-02 MED ORDER — FENTANYL 2.5 MCG/ML W/ROPIVACAINE 0.15% IN NS 100 ML EPIDURAL (ARMC)
12.0000 mL/h | EPIDURAL | Status: DC
  Administered 2017-05-02: 12 mL/h via EPIDURAL
  Filled 2017-05-02: qty 100

## 2017-05-02 MED ORDER — EPHEDRINE 5 MG/ML INJ: 10.0000 mg | INTRAVENOUS | Status: DC | PRN

## 2017-05-02 MED ORDER — BUPIVACAINE LIPOSOME 1.3 % IJ SUSP
INTRAMUSCULAR | Status: AC
  Filled 2017-05-02: qty 20

## 2017-05-02 MED ORDER — TERBUTALINE SULFATE 1 MG/ML IJ SOLN
INTRAMUSCULAR | Status: AC
  Administered 2017-05-02: 0.25 mg via SUBCUTANEOUS
  Filled 2017-05-02: qty 1

## 2017-05-02 MED ORDER — GLYCOPYRROLATE 0.2 MG/ML IJ SOLN
INTRAMUSCULAR | Status: DC | PRN
  Administered 2017-05-02: .8 mg via INTRAVENOUS

## 2017-05-02 MED ORDER — SIMETHICONE 80 MG PO CHEW
80.0000 mg | CHEWABLE_TABLET | Freq: Three times a day (TID) | ORAL | Status: DC
  Administered 2017-05-03 – 2017-05-05 (×7): 80 mg via ORAL
  Filled 2017-05-02 (×7): qty 1

## 2017-05-02 MED ORDER — SODIUM CHLORIDE 0.9 % IV SOLN: INTRAVENOUS | Status: DC | PRN

## 2017-05-02 MED ORDER — PHENYLEPHRINE HCL 10 MG/ML IJ SOLN
INTRAMUSCULAR | Status: AC
  Filled 2017-05-02: qty 1

## 2017-05-02 MED ORDER — SODIUM CHLORIDE 0.9 % IV SOLN
500.0000 mg | Freq: Once | INTRAVENOUS | Status: AC
  Administered 2017-05-02: 500 mg via INTRAVENOUS
  Filled 2017-05-02: qty 500

## 2017-05-02 MED ORDER — IBUPROFEN 600 MG PO TABS
600.0000 mg | ORAL_TABLET | Freq: Four times a day (QID) | ORAL | Status: DC
  Administered 2017-05-03 – 2017-05-05 (×10): 600 mg via ORAL
  Filled 2017-05-02 (×10): qty 1

## 2017-05-02 MED ORDER — MIDAZOLAM HCL 2 MG/2ML IJ SOLN
INTRAMUSCULAR | Status: AC
  Filled 2017-05-02: qty 2

## 2017-05-02 MED ORDER — METHYLENE BLUE 0.5 % INJ SOLN
INTRAVENOUS | Status: DC | PRN
  Administered 2017-05-02: 250 mL via INTRAVESICAL

## 2017-05-02 MED ORDER — LACTATED RINGERS IV SOLN
INTRAVENOUS | Status: DC
  Administered 2017-05-02 – 2017-05-03 (×2): via INTRAVENOUS

## 2017-05-02 MED ORDER — DIBUCAINE 1 % RE OINT: 1.0000 "application " | TOPICAL_OINTMENT | RECTAL | Status: DC | PRN

## 2017-05-02 MED ORDER — ONDANSETRON HCL 4 MG/2ML IJ SOLN
INTRAMUSCULAR | Status: AC
  Filled 2017-05-02: qty 2

## 2017-05-02 MED ORDER — COCONUT OIL OIL: 1.0000 "application " | TOPICAL_OIL | Status: DC | PRN

## 2017-05-02 MED ORDER — ONDANSETRON HCL 4 MG/2ML IJ SOLN: 4.0000 mg | Freq: Four times a day (QID) | INTRAMUSCULAR | Status: DC | PRN

## 2017-05-02 MED ORDER — ZOLPIDEM TARTRATE 5 MG PO TABS: 5.0000 mg | ORAL_TABLET | Freq: Every evening | ORAL | Status: DC | PRN

## 2017-05-02 MED ORDER — SIMETHICONE 80 MG PO CHEW
80.0000 mg | CHEWABLE_TABLET | ORAL | Status: DC | PRN
  Administered 2017-05-04 – 2017-05-05 (×2): 80 mg via ORAL
  Filled 2017-05-02 (×2): qty 1

## 2017-05-02 MED ORDER — DIPHENHYDRAMINE HCL 50 MG/ML IJ SOLN: 12.5000 mg | INTRAMUSCULAR | Status: DC | PRN

## 2017-05-02 MED ORDER — NALOXONE HCL 0.4 MG/ML IJ SOLN
0.4000 mg | INTRAMUSCULAR | Status: DC | PRN
  Filled 2017-05-02: qty 1

## 2017-05-02 MED ORDER — ROCURONIUM BROMIDE 50 MG/5ML IV SOLN
INTRAVENOUS | Status: AC
  Filled 2017-05-02: qty 1

## 2017-05-02 MED ORDER — TETANUS-DIPHTH-ACELL PERTUSSIS 5-2.5-18.5 LF-MCG/0.5 IM SUSP: 0.5000 mL | Freq: Once | INTRAMUSCULAR | Status: DC

## 2017-05-02 MED ORDER — FENTANYL CITRATE (PF) 100 MCG/2ML IJ SOLN
INTRAMUSCULAR | Status: DC | PRN
  Administered 2017-05-02: 25 ug via INTRAVENOUS
  Administered 2017-05-02: 100 ug via INTRAVENOUS
  Administered 2017-05-02: 25 ug via INTRAVENOUS
  Administered 2017-05-02 (×2): 50 ug via INTRAVENOUS

## 2017-05-02 MED ORDER — PRENATAL MULTIVITAMIN CH
1.0000 | ORAL_TABLET | Freq: Every day | ORAL | Status: DC
  Administered 2017-05-03 – 2017-05-05 (×3): 1 via ORAL
  Filled 2017-05-02 (×3): qty 1

## 2017-05-02 MED ORDER — WITCH HAZEL-GLYCERIN EX PADS: 1.0000 "application " | MEDICATED_PAD | CUTANEOUS | Status: DC | PRN

## 2017-05-02 MED ORDER — NEOSTIGMINE METHYLSULFATE 10 MG/10ML IV SOLN
INTRAVENOUS | Status: DC | PRN
  Administered 2017-05-02: 5 mg via INTRAVENOUS

## 2017-05-02 MED ORDER — LACTATED RINGERS IV SOLN
INTRAVENOUS | Status: DC | PRN
  Administered 2017-05-02: 07:00:00 via INTRAVENOUS

## 2017-05-02 MED ORDER — SODIUM CHLORIDE FLUSH 0.9 % IV SOLN
INTRAVENOUS | Status: AC
  Filled 2017-05-02: qty 10

## 2017-05-02 MED ORDER — CEFAZOLIN SODIUM-DEXTROSE 2-3 GM-%(50ML) IV SOLR
INTRAVENOUS | Status: DC | PRN
  Administered 2017-05-02: 2 g via INTRAVENOUS

## 2017-05-02 MED ORDER — LIDOCAINE HCL (PF) 1 % IJ SOLN
INTRAMUSCULAR | Status: AC
  Filled 2017-05-02: qty 30

## 2017-05-02 MED ORDER — OXYTOCIN 40 UNITS IN LACTATED RINGERS INFUSION - SIMPLE MED
2.5000 [IU]/h | INTRAVENOUS | Status: AC
  Administered 2017-05-02: 2.5 [IU]/h via INTRAVENOUS
  Filled 2017-05-02: qty 1000

## 2017-05-02 MED ORDER — HYDROMORPHONE HCL 1 MG/ML IJ SOLN
0.5000 mg | INTRAMUSCULAR | Status: AC | PRN
  Administered 2017-05-02 (×4): 0.5 mg via INTRAVENOUS

## 2017-05-02 SURGICAL SUPPLY — 27 items
BARRIER ADHS 3X4 INTERCEED (GAUZE/BANDAGES/DRESSINGS) ×4 IMPLANT
CANISTER SUCT 3000ML PPV (MISCELLANEOUS) ×4 IMPLANT
CHLORAPREP W/TINT 26ML (MISCELLANEOUS) ×8 IMPLANT
DRSG TELFA 3X8 NADH (GAUZE/BANDAGES/DRESSINGS) ×4 IMPLANT
ELECT CAUTERY BLADE 6.4 (BLADE) ×4 IMPLANT
ELECT REM PT RETURN 9FT ADLT (ELECTROSURGICAL) ×4
ELECTRODE REM PT RTRN 9FT ADLT (ELECTROSURGICAL) ×2 IMPLANT
GAUZE SPONGE 4X4 12PLY STRL (GAUZE/BANDAGES/DRESSINGS) ×4 IMPLANT
GLOVE BIO SURGEON STRL SZ8 (GLOVE) ×32 IMPLANT
GOWN STRL REUS W/ TWL LRG LVL3 (GOWN DISPOSABLE) ×6 IMPLANT
GOWN STRL REUS W/ TWL XL LVL3 (GOWN DISPOSABLE) ×6 IMPLANT
GOWN STRL REUS W/TWL LRG LVL3 (GOWN DISPOSABLE) ×6
GOWN STRL REUS W/TWL XL LVL3 (GOWN DISPOSABLE) ×6
NEEDLE HYPO 22GX1.5 SAFETY (NEEDLE) IMPLANT
NEEDLE HYPO 25X1 1.5 SAFETY (NEEDLE) ×4 IMPLANT
NS IRRIG 1000ML POUR BTL (IV SOLUTION) ×4 IMPLANT
PACK C SECTION AR (MISCELLANEOUS) ×4 IMPLANT
PAD OB MATERNITY 4.3X12.25 (PERSONAL CARE ITEMS) ×4 IMPLANT
PAD PREP 24X41 OB/GYN DISP (PERSONAL CARE ITEMS) ×4 IMPLANT
STAPLER INSORB 30 2030 C-SECTI (MISCELLANEOUS) ×4 IMPLANT
STRAP SAFETY 5IN WIDE (MISCELLANEOUS) ×4 IMPLANT
SUT CHROMIC 1 CTX 36 (SUTURE) ×12 IMPLANT
SUT PLAIN GUT 0 (SUTURE) ×8 IMPLANT
SUT VIC AB 0 CT1 36 (SUTURE) ×8 IMPLANT
SYR 30ML LL (SYRINGE) ×24 IMPLANT
TUBE FEED INF 5F (TUBING) ×4 IMPLANT
VICRYL 3-0 ×16 IMPLANT

## 2017-05-02 NOTE — Addendum Note (Signed)
Addendum  created 05/02/17 1128 by Alver FisherPenwarden, Alexiz Sustaita, MD   Intraprocedure Event edited, Intraprocedure Staff edited

## 2017-05-02 NOTE — Transfer of Care (Signed)
Immediate Anesthesia Transfer of Care Note  [facility]  Procedure(s) Performed: CESAREAN SECTION (N/A ) BLADDER REPAIR (N/A Bladder)  Patient Location: PACU  Anesthesia Type:General  Level of Consciousness: awake and alert   Airway & Oxygen Therapy: Patient Spontanous Breathing and Patient connected to face mask oxygen  Post-op Assessment: Report given to RN and Post -op Vital signs reviewed and stable  Post vital signs: Reviewed and stable  Last Vitals:  Vitals:   05/02/17 0850 05/02/17 0859  BP: (!) 80/54 (!) 109/57  Pulse: 72 98  Resp: 13 17  Temp: (!) 35.9 C   SpO2: 100% 100%    Last Pain:  Vitals:   05/02/17 0156  TempSrc: Oral  PainSc:          Complications: No apparent anesthesia complications

## 2017-05-02 NOTE — Anesthesia Post-op Follow-up Note (Signed)
Anesthesia QCDR form completed.        

## 2017-05-02 NOTE — Addendum Note (Signed)
Addendum  created 05/02/17 1157 by Alver FisherPenwarden, Johnna Bollier, MD   Child order released for a procedure order, Intraprocedure Attestations filed, Intraprocedure Blocks edited, Intraprocedure Event edited, Intraprocedure Meds edited, Sign clinical note

## 2017-05-02 NOTE — Anesthesia Procedure Notes (Signed)
Procedure Name: Intubation Date/Time: 05/02/2017 6:50 AM Performed by: Molli Barrows, MD Pre-anesthesia Checklist: Patient identified, Patient being monitored, Timeout performed, Emergency Drugs available and Suction available Patient Re-evaluated:Patient Re-evaluated prior to induction Oxygen Delivery Method: Circle system utilized Preoxygenation: Pre-oxygenation with 100% oxygen Induction Type: IV induction Ventilation: Mask ventilation without difficulty Laryngoscope Size: Mac and 3 Grade View: Grade I Tube type: Oral Tube size: 7.0 mm Number of attempts: 1 Airway Equipment and Method: Stylet Placement Confirmation: ETT inserted through vocal cords under direct vision,  positive ETCO2 and breath sounds checked- equal and bilateral Secured at: 21 cm Tube secured with: Tape Dental Injury: Teeth and Oropharynx as per pre-operative assessment

## 2017-05-02 NOTE — Anesthesia Postprocedure Evaluation (Signed)
Anesthesia Post Note  Patient: EconomistKrystle Larsson  Procedure(s) Performed: CESAREAN SECTION (N/A ) BLADDER REPAIR (N/A Bladder)  Patient location during evaluation: PACU Anesthesia Type: Epidural Level of consciousness: awake and alert Pain management: pain level controlled Vital Signs Assessment: post-procedure vital signs reviewed and stable Respiratory status: spontaneous breathing and respiratory function stable Cardiovascular status: stable Anesthetic complications: no     Last Vitals:  Vitals:   05/02/17 0859 05/02/17 0900  BP: (!) 109/57 102/67  Pulse: 98 72  Resp: 17 14  Temp:    SpO2: 100%     Last Pain:  Vitals:   05/02/17 0850  TempSrc:   PainSc: 8                  Jourdain Guay K

## 2017-05-02 NOTE — Progress Notes (Signed)
Pt having problems with pain management, discussed with surgeon that we could perform TAP block to try to help control pain. Verified that other local anesthetic was not used during procedure. TAP block procedure discussed with pt, discussed that this should help alleviate some pain but that she may still have some cramping pain. Will plan to use exparel in block to give longer term pain relief. Risks and benefits of the block were discussed, pt is amenable to proceeding with TAB block.

## 2017-05-02 NOTE — Progress Notes (Signed)
Patient ID: Kim Kidd Kim Kidd, female   DOB: 1985/08/20, 32 y.o.   MRN: [id number] symptomatic anemia with SBP 80's  HCT 22%  Will administer 2 unit blood transfusion and continue to  Monitor .

## 2017-05-02 NOTE — Progress Notes (Signed)
Monica Alvester MorinNewton and Blane Oharaose Marjo Grosvenor, RN unable to draw cord gas from [place] infant cord d/t lack of blood in cord despite clamped cord.

## 2017-05-02 NOTE — Anesthesia Procedure Notes (Signed)
Anesthesia Regional Block: TAP block   Pre-Anesthetic Checklist: ,, timeout performed, Correct Patient, Correct Site, Correct Laterality, Correct Procedure, Correct Position, site marked, Risks and benefits discussed,  Surgical consent,  Pre-op evaluation,  At surgeon's request and post-op pain management  Laterality: Left and Right  Prep: chloraprep       Needles:  Injection technique: Single-shot  Needle Type: Stimiplex     Needle Length: 10cm  Needle Gauge: 20     Additional Needles:   Procedures:,,,, ultrasound used (permanent image in chart),,,,  Narrative:  Start time: 05/02/2017 11:45 AM End time: 05/02/2017 11:53 AM Injection made incrementally with aspirations every 5 mL.  Performed by: Personally  Anesthesiologist: Alver FisherPenwarden, Amaia Lavallie, MD  Additional Notes: Functioning IV was confirmed and monitors were applied.  A Stimuplex needle was used. Sterile prep and drape,hand hygiene and sterile gloves were used.  Negative aspiration and negative test dose prior to incremental administration of local anesthetic. The patient tolerated the procedure well.

## 2017-05-02 NOTE — Op Note (Signed)
NAME:  Kim Kidd, Kim Kidd          ACCOUNT NO.:  [id number]  MEDICAL RECORD NO.:  [id number]  LOCATION:                                 FACILITY:  PHYSICIAN:  Thomas Schermerhorn, MDDATE OF BIRTH:  10/08/1985  DATE OF PROCEDURE: DATE OF DISCHARGE:                              OPERATIVE REPORT   PREOPERATIVE DIAGNOSIS: 1. A 39+4 weeks estimated gestational age. 2. Fetal bradycardia, unresponsive to in-utero resuscitation.  POSTOPERATIVE DIAGNOSIS: 1. A 39+4 weeks estimated gestational age. 2. Fetal bradycardia. 3. Bladder injury.  PROCEDURE PERFORMED: 1. Stat low transverse cesarean section. 2. Bladder repair with Urology consultant, Dr. Scott Stoioff.  SURGEON:  Thomas Schermerhorn, MD  ANESTHESIA:  General endotracheal anesthesia.  FIRST ASSISTANT:  Dr. Christianna Schumann III  CONSULTED UROLOGY:  Dr. Scott Stoioff.  INDICATION:  This is a 32-year-old gravida 8, para 6 patient, who was admitted to [facility] late evening on May 01, 2017.  The patient had made cervical change from 2.5 cm to 3 cm. Given her positive GBS status, she was started on clindamycin and was allowed to passively labor through the night.  Early morning on May 02, 2017, the fetus developed bradycardic episodes which became persistent at 06:14.  Dr. Schumann initially evaluated the patient given on my way in.  Dr. Schumann ruptured, noted thick meconium and fetal bradycardia persisted.  She called for a stat cesarean section.  I have met her in the operating room and the procedure was commenced.  DESCRIPTION OF PROCEDURE:  After adequate, the patient is being prepped and draped in normal sterile fashion, general endotracheal anesthesia was performed.  A Pfannenstiel incision was made at 06:47.  Sharp dissection was used to identify the fascia.  Fascia was opened in the midline and opened in a transverse fashion.  Blunt dissection was used to gain entrance into the  peritoneal cavity.  A low transverse uterine incision was made.  It was noted during the blunt dissection that there was a bladder injury approximately 8 cm in length.  Uterine incision was made and thick meconium fluid was noted.  Fetal head was brought to the incision and the floppy female was delivered and quickly passed to nursery staff, who assigned Apgar scores of 2, 6, 6 and 7.  The placenta was manually delivered.  The uterus was exteriorized and the endometrial cavity was wiped clean with laparotomy tape.  The uterine incision was closed with 1 chromic suture in a running locking fashion with good approximation of edges.  Given the large defect in the bladder, I felt it was prudent to have Urology come in to assist in the repair.  Dr. Scott Stoioff, Urology, came in shortly.  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  Dr. Dr. Stoioff retrograde passed a 5 mm feeding tube into the right ureteral orifice without difficulty.  He closed the bladder in a 2 layer closure.  The bladder was retrograde filled with methylene blue.  A small amount of leakage was noted, which resolved after 2 additional figure-of-eight sutures.  Please refer Dr. Scott Stoioff operative note.  The posterior cul-de-sac was then irrigated   and suctioned and the uterus was placed back into the abdominal cavity and the pericolic gutters were wiped clean with laparotomy tape.  The fascia was then closed with 0 Vicryl suture in a running nonlocking fashion with good approximation of the edges.  Subcutaneous tissues were irrigated and bovied for hemostasis, and the skin was reapproximated with Insorb absorbable staples.  The patient tolerated the procedure well.  ESTIMATED BLOOD LOSS:  750 mL.  INTRAOPERATIVE FLUIDS:  1300 mL.  The patient was taken to recovery room in good condition.  Given the emergent nature of the procedure, she did  receive 2 g Ancef after the commencement of the case and 500 mg azithromycin also during the procedure.  Foley catheter was placed and will remain.          ______________________________ Laverta Baltimore, MD     TS/MEDQ  D:  05/02/2017  T:  05/02/2017  Job:  [id number]

## 2017-05-02 NOTE — Brief Op Note (Signed)
05/02/2017  8:44 AM  PATIENT:  Burnell BlanksKrystle Chiarelli  32 y.o. female  PRE-OPERATIVE DIAGNOSIS: fetal bradycardia POST-OPERATIVE DIAGNOSIS:  same as preop Bladder injury PROCEDURE:  Procedure(s): CESAREAN SECTION (N/A) BLADDER REPAIR (N/A)  SURGEON:  Surgeon(s) and Role:    * Dwanda Tufano, Ihor Austinhomas J, MD - Primary    * Schuman, Jaquelyn Bitterhristanna R, MD - Assisting Urology consultant: Irineo AxonScott Stoioff , MD PHYSICIAN ASSISTANT:   ASSISTANTS: none   ANESTHESIA:   general  EBL:  750 cc BLOOD ADMINISTERED:none  DRAINS: Urinary Catheter (Foley)   LOCAL MEDICATIONS USED:  NONE  SPECIMEN:  Source of Specimen:  plcenta  DISPOSITION OF SPECIMEN:  PATHOLOGY  COUNTS:  YES  TOURNIQUET:  * No tourniquets in log *  DICTATION: .Other Dictation: Dictation Number verbal  PLAN OF CARE: Admit to inpatient   PATIENT DISPOSITION:  PACU - hemodynamically stable.   Delay start of Pharmacological VTE agent (>24hrs) due to surgical blood loss or risk of bleeding: not applicable

## 2017-05-02 NOTE — Progress Notes (Signed)
Patient is felling better- sitting up eating clear liquids this evening. Pain controlled with PCA, adequate urine output from foley catheter, VSS, 2nd unit of blood transfusing as ordered. Spouse at bedside, very supportive- both aware of current plan of care.

## 2017-05-02 NOTE — H&P (Signed)
OB History & Physical   History of Present Illness:  Chief Complaint: pt is a [facility] that was admitted late last PM with cervical change . Unassigned pt previously seen by Gavin Potters .  Ahsoka.Rossetti with PTD  History . 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 [facility] 05/05/17 39+4 HPI:  Kim Kidd is a 32 y.o. [id number] female at [redacted]w[redacted]d dated by u/s.  She presents to L&D for CTX   Pregnancy Issues: 1. MJ use  2. Left nephrectomy age 41 , nl BUN / cr in pregnancy    Maternal Medical History:   Past Medical History:  Diagnosis Date  . History of blood transfusion   . History of preterm labor     Past Surgical History:  Procedure Laterality Date  . APPENDECTOMY    . CHOLECYSTECTOMY    . NEPHRECTOMY Left     Allergies  Allergen Reactions  . Penicillins Hives    Prior to Admission medications   Not on File     Prenatal care site:[facility]  Social History: She  reports that she has been smoking cigarettes.  She has a 15.00 pack-year smoking history. She uses smokeless tobacco. She reports that she does not drink alcohol or use drugs.  Family History: family history includes Cancer in her paternal grandfather and paternal grandmother; Hypertension in her maternal grandmother.   Review of Systems: A full review of systems was performed and negative except as noted in the HPI.     Physical Exam:  Vital Signs: BP 103/66   Pulse 61   Temp (!) 97.5 F (36.4 C) (Oral)   Resp 19   Ht 5\' 7"  (1.702 m)   Wt 73.9 kg (163 lb)   LMP 06/01/2016 (Approximate)   SpO2 98%   Breastfeeding? Unknown   BMI 25.53 kg/m  General: no acute distress.  HEENT: normocephalic, atraumatic Heart: regular rate & rhythm.  No murmurs/rubs/gallops Lungs: clear to auscultation bilaterally, normal respiratory effort Abdomen: soft, gravid, non-tender;   Pelvic:   External: Normal external female genitalia  Cervix: Dilation: 10 / Effacement (%): 100 / Station: -2     Extremities:Neurologic: Alert & oriented x 3.    Results for orders placed or performed during the hospital encounter of 05/01/17 (from the past 24 hour(s))  CBC     Status: Abnormal   Collection Time: 05/01/17 10:47 PM  Result Value Ref Range   WBC 8.4 3.6 - 11.0 K/uL   RBC 3.48 (L) 3.80 - 5.20 MIL/uL   Hemoglobin 11.6 (L) 12.0 - 16.0 g/dL   HCT 84.1 (L) 32.4 - 40.1 %   MCV 94.7 80.0 - 100.0 fL   MCH 33.2 26.0 - 34.0 pg   MCHC 35.1 32.0 - 36.0 g/dL   RDW 02.7 25.3 - 66.4 %   Platelets 255 150 - 440 K/uL  Type and screen [facility]     Status: None   Collection Time: 05/01/17 10:47 PM  Result Value Ref Range   ABO/RH(D) AB POS    Antibody Screen NEG    Sample Expiration      05/04/2017 Performed at [facility], [address] [place], [place] [address]   Urine Drug Screen, Qualitative ([facility] only)     Status: Abnormal   Collection Time: 05/01/17 10:47 PM  Result Value Ref Range   Tricyclic, Ur Screen NONE DETECTED NONE DETECTED   Amphetamines, Ur Screen NONE DETECTED NONE DETECTED  MDMA (Ecstasy)Ur Screen NONE DETECTED NONE DETECTED   Cocaine Metabolite,Ur [place] NONE DETECTED NONE DETECTED   Opiate, Ur Screen NONE DETECTED NONE DETECTED   Phencyclidine (PCP) Ur S NONE DETECTED NONE DETECTED   Cannabinoid 50 Ng, Ur [place] POSITIVE (A) NONE DETECTED   Barbiturates, Ur Screen NONE DETECTED NONE DETECTED   Benzodiazepine, Ur Scrn NONE DETECTED NONE DETECTED   Methadone Scn, Ur NONE DETECTED NONE DETECTED    Pertinent Results:  Prenatal Labs: Blood type/Rh AB+  Antibody screen neg  Rubella Immune  Varicella Immune  RPR NR  HBsAg Neg  HIV NR  GC neg  Chlamydia neg  Genetic screening negative  1 hour GTT   3 hour GTT   GBS    FHT: TOCO: SVE:  Dilation: 10 / Effacement (%): 100 / Station: -2    Cephalic by leopolds  Dg Abd 1 View  Result Date: 05/02/2017 CLINICAL DATA:  Status post cesarean section common no instrument count EXAM:  ABDOMEN - 1 VIEW COMPARISON:  None. FINDINGS: Scattered large and small bowel gas is noted. Epidural catheter is noted over the lumbar spine. No radiopaque foreign body is seen. Surgical clip is noted on the left. IMPRESSION: No radiopaque foreign body is identified. Electronically Signed   By: Alcide CleverMark  Lukens M.D.   On: 05/02/2017 08:27    Assessment:  Kim Kidd is a 32 y.o. [id number] female at 3233w4d with active labor.   Plan:  1. Admit to Labor & Delivery 2. CBC, T&S, Clrs, IVF 3. GBS   4. Consents obtained. 5. Continuous efm/toco 6.     At 0614 on 05/02/16 fetal decels noted , Bradycardia didn't respond to inutero resuscitation. Dr Jerene PitchSchuman evaled pt with ROM ( thick Mec) And presenting part was high . Stat c/s called . I was on my way in at this time . Marland Kitchen. GETA performed and start time with me as primary and Dr Jerene PitchSchuman as assistant was [phone]. Delivery at [phone]  Floppy female passed to Nursery staff. Apgars 2/6/6/7 . No cord gas obtained    ----- Jennell Cornerhomas Kaydence Baba MD Attending Obstetrician and Gynecologist [facility], Department of OB[facility]N [facility]

## 2017-05-02 NOTE — Progress Notes (Signed)
Called to the room at 06:30 for a prolonged fetal heart tone deceleration. Patient was being managed by Dr. Feliberto GottronSchermerhorn, but he was not in house and was on his way to the hospital. In the room patient was on her left side. We moved her to her back and  I examined the patient. She was complete with a bulging bag of water but the baby was floating and not engaged in the pelvis. 10/100/-4 I ruptured the patient, there was thick meconium. Fetal scalp electrode placed. Tones were in the 80s. Infant was too high for a vacuum delivery. I called a stat cesarean section and the patient was moved to the OR.  Dr. Feliberto GottronSchermerhorn arrived as we were preparing for cesarean delivery and he was present to start the case.  Adelene Idlerhristanna Schuman MD 05/02/17 8:45 AM

## 2017-05-02 NOTE — Discharge Summary (Addendum)
Obstetric Discharge Summary   Patient ID: Patient Name: Kim MiloKrystle Foye DOB: 10-22-1985 MRN: [id number]  Date of Admission: 05/01/2017 Date of Delivery: 05/02/17 Delivered NW:GNFAOZHYQMVHby:Schermerhorn. MD  Date of Discharge: 05/05/17 Primary OB: [facility]  QIO:[id number]:Patient's last menstrual period was 06/01/2016 (approximate). EDC Estimated Date of Delivery: 05/03/17 Gestational Age at Delivery: [id number]   Antepartum complications:fetal bradycardia requiring stat cesarean section . Bladder injury introp Admitting Diagnosis: labor   Secondary Diagnoses: Patient Active Problem List   Diagnosis Date Noted  . Postoperative state 05/02/2017  . Uterine contractions during pregnancy 05/01/2017  . Premature uterine contractions 04/13/2017  . False labor after 37 weeks of gestation without delivery 04/13/2017  . Abdominal pain 03/31/2017  . Insufficient prenatal care 03/07/2016  . 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 Postpartum care following vaginal delivery 03/07/2016    Augmentation: None Complications: Bladder injury during cesarean  section  Intrapartum complications/course: see op note and progress notes.  Fetal bradycardia led to stat cesarean Delivery Type: LTCS Anesthesia: epidural Placenta: sponatneous  Newborn Data: Live born female  Birth Weight: 6 lb 1 oz (2750 g) APGAR: , 2/6/6/7  Newborn Delivery   Birth date/time:  05/02/2017 06:48:00 Delivery type:  LTCS C-section categorization:  Primary     Postpartum Course  Patient had sypmtomatic anemia in PACU and required 2 unit blood transfusion . She maintained her foley for bladder injury and was given antibiotics.  By time of discharge on POD# , her pain was controlled on oral pain medications; she had appropriate lochia and was ambulating, voiding without difficulty and tolerating regular diet.  She was deemed stable for discharge to home.       Labs: CBC Latest Ref Rng & Units 05/05/2017 05/04/2017 05/03/2017  WBC 3.6 - 11.0 K/uL 10.5 10.8 10.8   Hemoglobin 12.0 - 16.0 g/dL 1.3(K9.1(L) 4.4(W9.3(L) 1.0(U9.1(L)  Hematocrit 35.0 - 47.0 % 26.4(L) 26.6(L) 26.3(L)  Platelets 150 - 440 K/uL 188 163 165   Placenta PAth:   SURGICAL PATHOLOGY Surgical Pathology  CASE: [id number]  PATIENT: Kim MiloKRYSTLE Purdie  Surgical Pathology Report      SPECIMEN SUBMITTED:  A. Placenta   CLINICAL HISTORY:  None provided   PRE-OPERATIVE DIAGNOSIS:  Fetal decelerations   POST-OPERATIVE DIAGNOSIS:  Same as pre-op      DIAGNOSIS:  A. PLACENTA; CESAREAN SECTION:  - THIRD TRIMESTER PLACENTA, 407 GRAMS (10TH PERCENTILE FOR PROVIDED  GESTATIONAL AGE).  -THREE-VESSEL UMBILICAL CORD WITH ECCENTRIC INSERTION, EARLY FUNISITIS  AND VASCULITIS.  - CHORIOAMNIONITIS.  - PLACENTAL PARENCHYMA WITH FEATURES OF UTERO PLACENTAL INSUFFICIENCY.          Physical exam:  BP 116/70 (BP Location: Right Arm)   Pulse 61   Temp 99 F (37.2 C) (Oral)   Resp 18   Ht 5\' 7"  (1.702 m)   Wt 73.9 kg (163 lb)   LMP 06/01/2016 (Approximate)   SpO2 96%   Breastfeeding? Unknown   BMI 25.53 kg/m  General: alert and no distress Pulm: normal respiratory effort Lochia: appropriate Abdomen: soft, NT Uterine Fundus: firm, below umbilicus Incision: c/d/i, healing well, no significant drainage, no dehiscence, no significant erythema Extremities: No evidence of DVT seen on physical exam. No lower extremity edema.   Disposition: stable, discharge to home Baby Feeding: formula Baby Disposition: NICU  Contraception: desires interval l/s BTL   Prenatal Labs:  AB+ Blood type/Rh AB+  Antibody screen neg  Rubella Immune  Varicella Immune  RPR NR  HBsAg Neg  HIV NR  GC neg  Chlamydia neg  Genetic screening negative  1 hour GTT   3 hour GTT   GBS + treated 1 dose clindamycin       Plan:  Kennidy Domingo was discharged to home in good condition. Follow-up appointment at [facility] with Dr. Feliberto Gottron in 10 days  Discharge Instructions:  Per After Visit Summary. Activity: Advance as tolerated. Pelvic rest for 6 weeks.   Diet: Regular Discharge Medications: Allergies as of 05/05/2017      Reactions   Penicillins Hives      Medication List    TAKE these medications   ibuprofen 600 MG tablet Commonly known as:  ADVIL,MOTRIN Take 1 tablet (600 mg total) by mouth every 6 (six) hours.   nicotine 14 mg/24hr patch Commonly known as:  NICODERM CQ - dosed in mg/24 hours Place 1 patch (14 mg total) onto the skin daily. Start taking on:  05/06/2017   oxyCODONE 5 MG immediate release tablet Commonly known as:  Oxy IR/ROXICODONE Take 1-2 tablets (5-10 mg total) by mouth every 6 (six) hours as needed for severe pain.   sulfamethoxazole-trimethoprim 400-80 MG tablet Commonly known as:  BACTRIM,SEPTRA Take 1 tablet by mouth daily. Start taking on:  05/06/2017            Discharge Care Instructions  (From admission, onward)        Start     Ordered   05/05/17 0000  Discharge wound care:    Comments:  Keep incision dry, clean.   05/05/17 1304     Outpatient follow up:  Follow-up Information    Schermerhorn, Ihor Austin, MD. Call in 10 day(s).   Specialty:  Obstetrics and Gynecology Why:  post op to arrange for urology cystogram  Contact information: [address] [place] [address] [phone]            Signed:  Elenora Fender Ward

## 2017-05-02 NOTE — Anesthesia Procedure Notes (Signed)
Epidural Patient location during procedure: OB  Staffing Performed: anesthesiologist   Preanesthetic Checklist Completed: patient identified, site marked, surgical consent, pre-op evaluation, timeout performed, IV checked, risks and benefits discussed and monitors and equipment checked  Epidural Patient position: sitting Prep: Betadine Patient monitoring: heart rate, continuous pulse ox and blood pressure Approach: midline Location: L4-L5 Injection technique: LOR saline  Needle:  Needle type: Tuohy  Needle gauge: 17 G Needle length: 9 cm and 9 Needle insertion depth: 6 cm Catheter type: closed end flexible Catheter size: 19 Gauge Catheter at skin depth: 12 cm Test dose: negative and 1.5% lidocaine with Epi 1:200 K  Assessment Sensory level: T10 Events: blood not aspirated, injection not painful, no injection resistance, negative IV test and no paresthesia  Additional Notes   Patient tolerated the insertion well without complications.-SATD -IVTD. No paresthesia. Refer to [facility] nursing for VS and dosingReason for block:procedure for pain     

## 2017-05-02 NOTE — Anesthesia Preprocedure Evaluation (Signed)

## 2017-05-02 NOTE — Op Note (Signed)
Preoperative diagnosis:  1. Intraoperative bladder injury  Postoperative diagnosis:  1. Intraoperative bladder injury  Procedure: 1. Closure of bladder injury  Surgeon: Kim AltesScott C Alysandra Lobue, MD  Anesthesia: General  Complications: None  Intraoperative findings: Approximately 10 cm transverse defect anterior bladder wall.  Posterior bladder intact without evidence of ureteral injury  EBL: Minimal  Specimens: None  Indication: Kim Kidd is a 32 y.o. patient who underwent a stat C-section with anterior bladder wall injury noted intraoperatively felt secondary to significant adhesions encountered during dissection.  Intraoperative urology consultation was requested.    Description of procedure:  After scrubbing the bladder was examined.  There was an approximately 10 cm transverse defect in the anterior bladder wall.  The trigone and posterior wall were intact.  She has a prior history of a left nephrectomy as an infant.  The right ureteral orifice was identified and was effluxing clear urine.  The left ureteral orifice was not identified.  A 5 French feeding tube was inserted into the right ureteral orifice and advanced without difficulty with return of clear urine.  The feeding tube was removed.  Bladder closure had been started by Dr. Feliberto GottronSchermerhorn with 3-0 Vicryl.  I completed this closure with a second layer closure.  The Foley catheter was then filled with methylene blue diluted with sterile saline.  At approximately 180 mL some leakage was noted on the right aspect of the suture line which was reinforced with interrupted 3-0 Vicryl figure-of-eight sutures.  A second area of muscle separation was noted which was closed.  The bladder was refilled to 150 mL and no leakage was noted. Abdominal closure was performed by Dr. Feliberto GottronSchermerhorn.  Recommendation:  Would leave an indwelling Foley catheter for at least 10-14 days.  Schedule cystogram prior to Foley catheter removal.  Will  follow.   Kim AltesScott C Josten Kidd, M.D.

## 2017-05-02 NOTE — Progress Notes (Signed)
RN in room to assess pt and check blood transfusion; pt's significant other and her mom are at bedside; pt's mom asking about being able to "call and check on her"; pt replied, "i'm in the directory"; RN explained that being in the directory means that staff are allowed to tell visitors she is here and where her room is; the pt and her mother would like to be able to have a password they can have so the mom can call the nurse and get an update about her daughter; the patient is fine with this and said "yes, I want you to be able to tell my mom how i'm doing"; RN told pt that the password is ONLY for her mom and her mom is the ONLY one to use it to get information; pt and mom choose the word "pinapples"; the pt's mother's name is Kendal HymenBonnie; if the pt's mother calls, she is to tell the RN her name and "pineapples" before RN will update her; pt is good with this plan

## 2017-05-02 NOTE — Progress Notes (Signed)
Patient ID: Kim MiloKrystle Kidd, female   DOB: [birth year], 32 y.o.   MRN: [id number] Reviewed todays events with parents . Pt is feeling better with blood transfusion . Hemodynamically stable  PCA working well . Neonatologist has given a favorable progress report  of the infant today .  All questions answered  Continue care .

## 2017-05-02 NOTE — Progress Notes (Signed)
Patient ID: Kim Kidd, female   DOB: [birth year], 32 y.o.   MRN: [id number] Symptomatic anemia now post op 4.5 hrs . HCT 22 % I have ordered 2 unit blood transfusion

## 2017-05-03 ENCOUNTER — Encounter: Payer: Self-pay | Admitting: Obstetrics and Gynecology

## 2017-05-03 DIAGNOSIS — Z98891 History of uterine scar from previous surgery: Secondary | ICD-10-CM

## 2017-05-03 LAB — CBC WITH DIFFERENTIAL/PLATELET
Basophils Absolute: 0 10*3/uL (ref 0–0.1)
Basophils Relative: 0 %
Eosinophils Absolute: 0.1 10*3/uL (ref 0–0.7)
Eosinophils Relative: 1 %
HEMATOCRIT: 26.3 % — AB (ref 35.0–47.0)
HEMOGLOBIN: 9.1 g/dL — AB (ref 12.0–16.0)
LYMPHS PCT: 15 %
Lymphs Abs: 1.6 10*3/uL (ref 1.0–3.6)
MCH: 31.4 pg (ref 26.0–34.0)
MCHC: 34.7 g/dL (ref 32.0–36.0)
MCV: 90.4 fL (ref 80.0–100.0)
MONO ABS: 1 10*3/uL — AB (ref 0.2–0.9)
Monocytes Relative: 9 %
NEUTROS ABS: 8 10*3/uL — AB (ref 1.4–6.5)
NEUTROS PCT: 75 %
Platelets: 165 10*3/uL (ref 150–440)
RBC: 2.91 MIL/uL — AB (ref 3.80–5.20)
RDW: 14.6 % — AB (ref 11.5–14.5)
WBC: 10.8 10*3/uL (ref 3.6–11.0)

## 2017-05-03 LAB — RPR: RPR: NONREACTIVE

## 2017-05-03 LAB — CBC
HEMATOCRIT: 22.8 % — AB (ref 35.0–47.0)
Hemoglobin: 7.8 g/dL — ABNORMAL LOW (ref 12.0–16.0)
MCH: 31.7 pg (ref 26.0–34.0)
MCHC: 34.3 g/dL (ref 32.0–36.0)
MCV: 92.5 fL (ref 80.0–100.0)
PLATELETS: 176 10*3/uL (ref 150–440)
RBC: 2.46 MIL/uL — ABNORMAL LOW (ref 3.80–5.20)
RDW: 14.6 % — ABNORMAL HIGH (ref 11.5–14.5)
WBC: 9.4 10*3/uL (ref 3.6–11.0)

## 2017-05-03 LAB — PREPARE RBC (CROSSMATCH)

## 2017-05-03 LAB — HEMOGLOBIN AND HEMATOCRIT, BLOOD
HCT: 25.6 % — ABNORMAL LOW (ref 35.0–47.0)
Hemoglobin: 8.6 g/dL — ABNORMAL LOW (ref 12.0–16.0)

## 2017-05-03 MED ORDER — SODIUM CHLORIDE 0.9 % IV SOLN
Freq: Once | INTRAVENOUS | Status: AC
  Administered 2017-05-03: 16:00:00 via INTRAVENOUS

## 2017-05-03 MED ORDER — OXYCODONE HCL 5 MG PO TABS
5.0000 mg | ORAL_TABLET | Freq: Four times a day (QID) | ORAL | Status: DC | PRN
  Administered 2017-05-03 – 2017-05-05 (×9): 10 mg via ORAL
  Filled 2017-05-03 (×9): qty 2

## 2017-05-03 MED ORDER — SODIUM CHLORIDE 0.9 % IV SOLN
Freq: Once | INTRAVENOUS | Status: AC
  Administered 2017-05-03: 09:00:00 via INTRAVENOUS

## 2017-05-03 NOTE — Progress Notes (Signed)
Urology  Complaining of incisional pain VSS, afebrile  Foley catheter draining clear urine  Impression: Stable status post closure intraoperative bladder injury  Recommendation: Will schedule cystogram and follow-up approximately 10 days postop.

## 2017-05-03 NOTE — Plan of Care (Signed)
Vs stable for pt; did have high blood loss; did receive 2 units pRBCs; has morphine PCA for pain control; RN took pt on her bed to the SCN to see baby for the first time this shift and pt very happy about that; pt moving some in bed and RN told pt that's ok; tolerating some liquids; c-section dressing is clean/dry; foley to remain in place (will be discharge home with foley)

## 2017-05-03 NOTE — Discharge Instructions (Signed)
Indwelling Urinary Catheter Care, Adult °Take good care of your catheter to keep it working and to prevent problems. °How to wear your catheter °Attach your catheter to your leg with tape (adhesive tape) or a leg strap. Make sure it is not too tight. If you use tape, remove any bits of tape that are already on the catheter. °How to wear a drainage bag °You should have: °· A large overnight bag. °· A small leg bag. ° °Overnight Bag °You may wear the overnight bag at any time. Always keep the bag below the level of your bladder but off the floor. When you sleep, put a clean plastic bag in a wastebasket. Then hang the bag inside the wastebasket. °Leg Bag °Never wear the leg bag at night. Always wear the leg bag below your knee. Keep the leg bag secure with a leg strap or tape. °How to care for your skin °· Clean the skin around the catheter at least once every day. °· Shower every day. Do not take baths. °· Put creams, lotions, or ointments on your genital area only as told by your doctor. °· Do not use powders, sprays, or lotions on your genital area. °How to clean your catheter and your skin °1. Wash your hands with soap and water. °2. Wet a washcloth in warm water and gentle (mild) soap. °3. Use the washcloth to clean the skin where the catheter enters your body. Clean downward and wipe away from the catheter in small circles. Do not wipe toward the catheter. °4. Pat the area dry with a clean towel. Make sure to clean off all soap. °How to care for your drainage bags °Empty your drainage bag when it is ?-½ full or at least 2-3 times a day. Replace your drainage bag once a month or sooner if it starts to smell bad or look dirty. Do not clean your drainage bag unless told by your doctor. °Emptying a drainage bag ° °Supplies Needed °· Rubbing alcohol. °· Gauze pad or cotton ball. °· Tape or a leg strap. ° °Steps °1. Wash your hands with soap and water. °2. Separate (detach) the bag from your leg. °3. Hold the bag over  the toilet or a clean container. Keep the bag below your hips and bladder. This stops pee (urine) from going back into the tube. °4. Open the pour spout at the bottom of the bag. °5. Empty the pee into the toilet or container. Do not let the pour spout touch any surface. °6. Put rubbing alcohol on a gauze pad or cotton ball. °7. Use the gauze pad or cotton ball to clean the pour spout. °8. Close the pour spout. °9. Attach the bag to your leg with tape or a leg strap. °10. Wash your hands. ° °Changing a drainage bag °Supplies Needed °· Alcohol wipes. °· A clean drainage bag. °· Adhesive tape or a leg strap. ° °Steps °1. Wash your hands with soap and water. °2. Separate the dirty bag from your leg. °3. Pinch the rubber catheter with your fingers so that pee does not spill out. °4. Separate the catheter tube from the drainage tube where these tubes connect (at the connection valve). Do not let the tubes touch any surface. °5. Clean the end of the catheter tube with an alcohol wipe. Use a different alcohol wipe to clean the end of the drainage tube. °6. Connect the catheter tube to the drainage tube of the clean bag. °7. Attach the new bag to   the leg with adhesive tape or a leg strap. °8. Wash your hands. ° °How to prevent infection and other problems °· Never pull on your catheter or try to remove it. Pulling can damage tissue in your body. °· Always wash your hands before and after touching your catheter. °· If a leg strap gets wet, replace it with a dry one. °· Drink enough fluids to keep your pee clear or pale yellow, or as told by your doctor. °· Do not let the drainage bag or tubing touch the floor. °· Wear cotton underwear. °· If you are female, wipe from front to back after you poop (have a bowel movement). °· Check on the catheter often to make sure it works and the tubing is not twisted. °Get help if: °· Your pee is cloudy. °· Your pee smells unusually bad. °· Your pee is not draining into the bag. °· Your  tube gets clogged. °· Your catheter starts to leak. °· Your bladder feels full. °Get help right away if: °· You have redness, swelling, or pain where the catheter enters your body. °· You have fluid, pus, or a bad smell coming from the area where the catheter enters your body. °· The area where the catheter enters your body feels warm. °· You have a fever. °· You have pain in your: °? Stomach (abdomen). °? Legs. °? Lower back. °? Bladder. °· You see blood fill the catheter. °· Your pee is pink or red. °· You feel sick to your stomach (nauseous). °· You throw up (vomit). °· You have chills. °· Your catheter gets pulled out. °This information is not intended to replace advice given to you by your health care provider. Make sure you discuss any questions you have with your health care provider. °Document Released: 07/01/2012 Document Revised: 02/02/2016 Document Reviewed: 08/19/2013 °Elsevier Interactive Patient Education © 2018 [employer] °Cesarean Delivery, Care After °Refer to this sheet in the next few weeks. These instructions provide you with information about caring for yourself after your procedure. Your health care provider may also give you more specific instructions. Your treatment has been planned according to current medical practices, but problems sometimes occur. Call your health care provider if you have any problems or questions after your procedure. °What can I expect after the procedure? °After the procedure, it is common to have: °· A small amount of blood or clear fluid coming from the incision. °· Some redness, swelling, and pain in your incision area. °· Some abdominal pain and soreness. °· Vaginal bleeding (lochia). °· Pelvic cramps. °· Fatigue. ° °Follow these instructions at home: °Incision care ° °· Follow instructions from your health care provider about how to take care of your incision. Make sure you: °? Wash your hands with soap and water before you change your bandage (dressing). If  soap and water are not available, use hand sanitizer. °? If you have a dressing, change it as told by your health care provider. °? Leave stitches (sutures), skin staples, skin glue, or adhesive strips in place. These skin closures may need to stay in place for 2 weeks or longer. If adhesive strip edges start to loosen and curl up, you may trim the loose edges. Do not remove adhesive strips completely unless your health care provider tells you to do that. °· Check your incision area every day for signs of infection. Check for: °? More redness, swelling, or pain. °? More fluid or blood. °? Warmth. °? Pus or a bad   smell. °· When you cough or sneeze, hug a pillow. This helps with pain and decreases the chance of your incision opening up (dehiscing). Do this until your incision heals. °Medicines °· Take over-the-counter and prescription medicines only as told by your health care provider. °· If you were prescribed an antibiotic medicine, take it as told by your health care provider. Do not stop taking the antibiotic until it is finished. °Driving °· Do not drive or operate heavy machinery while taking prescription pain medicine. °Lifestyle °· Do not drink alcohol. This is especially important if you are breastfeeding or taking pain medicine. °· Do not use tobacco products, including cigarettes, chewing tobacco, or e-cigarettes. If you need help quitting, ask your health care provider. Tobacco can delay wound healing. °Eating and drinking °· Drink at least 8 eight-ounce glasses of water every day unless told not to by your health care provider. If you breastfeed, you may need to drink more water than this. °· Eat high-fiber foods every day. These foods may help prevent or relieve constipation. High-fiber foods include: °? Whole grain cereals and breads. °? Brown rice. °? Beans. °? Fresh fruits and vegetables. °Activity °· Return to your normal activities as told by your health care provider. Ask your health care provider  what activities are safe for you. °· Rest as much as possible. Try to rest or take a nap while your baby is sleeping. °· Do not lift anything that is heavier than your baby or 10 lb (4.5 kg) as told by your health care provider. °· Ask your health care provider when you can engage in sexual activity. This may depend on your: °? Risk of infection. °? Healing rate. °? Comfort and desire to engage in sexual activity. °Bathing °· Do not take baths, swim, or use a hot tub until your health care provider approves. Ask your health care provider if you can take showers. You may only be allowed to take sponge baths until your incision heals. °General instructions °· Do not use tampons or douches until your health care provider approves. °· Wear: °? Loose, comfortable clothing. °? A supportive and well-fitting bra. °· Watch for any blood clots that may pass from your vagina. These may look like clumps of dark red, brown, or black discharge. °· Keep your perineum clean and dry as told by your health care provider. °· Wipe from front to back when you use the toilet. °· If possible, have someone help you care for your baby and help with household activities for a few days after you leave the hospital. °· Keep all follow-up visits for you and your baby as told by your health care provider. This is important. °Contact a health care provider if: °· You have: °? Bad-smelling vaginal discharge. °? Difficulty urinating. °? Pain when urinating. °? A sudden increase or decrease in the frequency of your bowel movements. °? More redness, swelling, or pain around your incision. °? More fluid or blood coming from your incision. °? Pus or a bad smell coming from your incision. °? A fever. °? A rash. °? Little or no interest in activities you used to enjoy. °? Questions about caring for yourself or your baby. °? Nausea. °· Your incision feels warm to the touch. °· Your breasts turn red or become painful or hard. °· You feel unusually sad or  worried. °· You vomit. °· You pass large blood clots from your vagina. If you pass a blood clot, save it to show to   your health care provider. Do not flush blood clots down the toilet without showing your health care provider. °· You urinate more than usual. °· You are dizzy or light-headed. °· You have not breastfed and have not had a menstrual period for 12 weeks after delivery. °· You stopped breastfeeding and have not had a menstrual period for 12 weeks after stopping breastfeeding. °Get help right away if: °· You have: °? Pain that does not go away or get better with medicine. °? Chest pain. °? Difficulty breathing. °? Blurred vision or spots in your vision. °? Thoughts about hurting yourself or your baby. °? New pain in your abdomen or in one of your legs. °? A severe headache. °· You faint. °· You bleed from your vagina so much that you fill two sanitary pads in one hour. °This information is not intended to replace advice given to you by your health care provider. Make sure you discuss any questions you have with your health care provider. °Document Released: 11/26/2001 Document Revised: 04/08/2016 Document Reviewed: 02/08/2015 °Elsevier Interactive Patient Education © 2018 [employer]vier Inc. ° °

## 2017-05-03 NOTE — Progress Notes (Signed)
   05/03/17 1415  Clinical Encounter Type  Visited With Patient and family together  Visit Type Initial  Spiritual Encounters  Spiritual Needs Emotional   Per staff suggestion, chaplain visited with patient and family.  Patient appreciative of visit, patient reported on visit with her child.  Open to ongoing chaplain support.

## 2017-05-03 NOTE — Progress Notes (Signed)
Post-Op Day 1  Subjective: Feeling very tired, having some abdominal and incisional pain. Tolerating clear diet, reporting some throat pain.    No fever/chills, chest pain, shortness of breath, nausea/vomiting, or leg pain. No nipple or breast pain.  Objective: BP (!) 94/56 (BP Location: Left Arm)   Pulse 78   Temp 98.5 F (36.9 C)   Resp 16   Ht 5\' 7"  (1.702 m)   Wt 73.9 kg (163 lb)   LMP 06/01/2016 (Approximate)   SpO2 93%   Breastfeeding? Unknown   BMI 25.53 kg/m    Physical Exam:  General: alert, cooperative, appears stated age and mild distress Breasts: soft/nontender CV: RRR Pulm: nl effort, CTABL Abdomen: soft, non-tender, active bowel sounds Uterine Fundus: firm Incision: no significant drainage Lochia: appropriate GU: IUC in place draining blood-tinged urine DVT Evaluation: No evidence of DVT seen on physical exam. No cords or calf tenderness. No significant calf/ankle edema.  Recent Labs    05/02/17 2325 05/03/17 0512  HGB 8.2* 7.8*  HCT 23.9* 22.8*  WBC 9.7 9.4  PLT 191 176    Assessment/Plan: 32 y.o. [id number] postop day # 1  -Continue routine PP care -Add Tylenol to scheduled Motrin. Plan to convert morphine PCA to oxycodone PO when patient is tolerating regular diet.  -Acute blood loss anemia - hemodynamically stable; s/p 2u PRBCs, plan to give another 2 units of PRBCs today with 2h post-transfusion CBC.  -Foley catheter to remain in place with outpatient urology follow-up, due to intra-op bladder injury.  -Encourage ambulation as tolerated.   Disposition: Continue inpatient postpartum care.   LOS: 2 days   Genia DelMargaret Desree Leap, CNM 05/03/2017, 8:59 AM   ----- Genia DelMargaret Kana Reimann Certified Nurse Midwife [facility]

## 2017-05-04 ENCOUNTER — Other Ambulatory Visit: Payer: Self-pay | Admitting: Urology

## 2017-05-04 DIAGNOSIS — S3720XD Unspecified injury of bladder, subsequent encounter: Secondary | ICD-10-CM

## 2017-05-04 LAB — TYPE AND SCREEN
ABO/RH(D): AB POS
ANTIBODY SCREEN: NEGATIVE
UNIT DIVISION: 0
Unit division: 0
Unit division: 0
Unit division: 0

## 2017-05-04 LAB — BPAM RBC
BLOOD PRODUCT EXPIRATION DATE: 201902202359
Blood Product Expiration Date: 201903072359
Blood Product Expiration Date: 201903112359
Blood Product Expiration Date: 201903132359
ISSUE DATE / TIME: 201902131239
ISSUE DATE / TIME: 201902141534
ISSUE DATE / TIME: 201902131606
ISSUE DATE / TIME: 201902140944
UNIT TYPE AND RH: 7300
Unit Type and Rh: 600
Unit Type and Rh: 6200
Unit Type and Rh: 7300

## 2017-05-04 LAB — CBC
HEMATOCRIT: 26.6 % — AB (ref 35.0–47.0)
Hemoglobin: 9.3 g/dL — ABNORMAL LOW (ref 12.0–16.0)
MCH: 31.5 pg (ref 26.0–34.0)
MCHC: 35.1 g/dL (ref 32.0–36.0)
MCV: 89.7 fL (ref 80.0–100.0)
PLATELETS: 163 10*3/uL (ref 150–440)
RBC: 2.97 MIL/uL — ABNORMAL LOW (ref 3.80–5.20)
RDW: 14.4 % (ref 11.5–14.5)
WBC: 10.8 10*3/uL (ref 3.6–11.0)

## 2017-05-04 LAB — SURGICAL PATHOLOGY

## 2017-05-04 MED ORDER — NICOTINE 14 MG/24HR TD PT24
14.0000 mg | MEDICATED_PATCH | Freq: Every day | TRANSDERMAL | Status: DC
  Administered 2017-05-04: 14 mg via TRANSDERMAL
  Filled 2017-05-04 (×2): qty 1

## 2017-05-04 MED ORDER — SULFAMETHOXAZOLE-TRIMETHOPRIM 400-80 MG PO TABS
1.0000 | ORAL_TABLET | Freq: Every day | ORAL | Status: DC
  Administered 2017-05-04 – 2017-05-05 (×2): 1 via ORAL
  Filled 2017-05-04 (×2): qty 1

## 2017-05-04 NOTE — Progress Notes (Signed)
Subjective: Postpartum Day 2: Cesarean Delivery complicated by bladder injury and repair and blood transfusion 4 units for symptomatic anemia Patient reports feeling better  Walking  Foley in place Placed on 14 mg nicotine patch   Objective: Vital signs in last 24 hours: Temp:  [98.2 F (36.8 C)-98.7 F (37.1 C)] 98.7 F (37.1 C) (02/15 1520) Pulse Rate:  [69-78] 69 (02/15 1520) Resp:  [16-20] 18 (02/15 1520) BP: (102-122)/(53-98) 108/62 (02/15 1556) SpO2:  [95 %-97 %] 97 % (02/15 1520)  Physical Exam:  General: alert and cooperative Lochia: appropriate Uterine Fundus: firm Incision: 2 cc serosanguinous d/c from left lateral, no induration ,no ecchymosis DVT Evaluation: No evidence of DVT seen on physical exam.  Recent Labs    05/03/17 2056 05/04/17 0550  HGB 9.1* 9.3*  HCT 26.3* 26.6*    Assessment/Plan: Status post Cesarean section. Doing well postoperatively. stable . Small wound seroma draining . will place gauze tonight .   Start prophylactic septra DS qd( needs for d/c home) for prolonged foley use.  Leg bag and night time foley instructions tonight  F/up with me in 10 days and I will arrange for f/up with Dr Lonna CobbStoioff for outpt cystogram  She expresses interest in interval sterilization . I will schedule when she come in to see me . Ihor Austinhomas J Schermerhorn 05/04/2017, 9:15 PM

## 2017-05-04 NOTE — Clinical Social Work Maternal (Signed)
CLINICAL SOCIAL WORK MATERNAL/CHILD NOTE  Patient Details  Name: Kim Kidd MRN: [id number] Date of Birth: [birth year]  Date:  05/04/2017  Clinical Social Worker Initiating Note:  ([place], [place] [phone] ) Date/Time: Initiated:  05/04/17/1630     Child's Name:  Kim Ogle Kidd Jr. )   Biological Parents:  Mother, Father   Need for Interpreter:  None   Reason for Referral:  Current Substance Use/Substance Use During Pregnancy    Address:  [place] [address] [place] [address]    Phone number:  [phone] (home)     Additional phone number:   Household Members/Support Persons (HM/SP):   Household Member/Support Person 1   HM/SP Name Relationship DOB or Age  HM/SP -1 (Kim Kidd ) (Boyfriend )    HM/SP -2        HM/SP -3        HM/SP -4        HM/SP -5        HM/SP -6        HM/SP -7        HM/SP -8          Natural Supports (not living in the home):  Parent, Extended Family   Professional Supports: Therapist   Employment: Homemaker   Type of Work:     Education:  [employer] school graduate   Homebound arranged:    Museum/gallery curator Resources:  [employer]   Other Resources:  Physicist, medical , [place] Considerations Which Kidd Impact Care:    Strengths:  Ability to meet basic needs , Compliance with medical plan , Home prepared for child    Psychotropic Medications:         Pediatrician:       Pediatrician List:   [place]      Pediatrician Fax Number:    Risk Factors/Current Problems:  Substance Use    Cognitive State:  Able to Concentrate , Alert , Goal Oriented    Mood/Affect:      CSW Assessment: Clinical Education officer, museum (CSW) received a consult for substance abuse. Per nurse mother's uds is positive for marijuana and infant's is negative. Infant's cord tissue drug screen is pending. CSW met with mother and father of the  infant Kim Ogle at bedside. Mother reported that she wanted Kim Ogle to stay during assessment. CSW introduced self and explained role of CSW department. Mother reported that this is her 6th child and she lives in [place] with her boyfriend Kim Ogle and their 63 month old. Per mother her daughter is living with her maternal grandmother and her 3 sons are with their fathers. Mother reported that she got into trouble when she was 32 y.o and went to prison. Mother reported that her children visit her at her mother's house and they see her often. Mother reported that her children are doing good in school and are doing well with the living arrangements. Mother reported that her mother and father of the baby's parents are very supportive. Mother reported that she has not used marijuana in months and only used it when she was depressed. Mother reported that she was going to [facility] in [place] to get treatment for depression. Mother reported that she plans on returning to [place]. Mother reported that father of the baby Kim Ogle works and she stays at home but is planning  on going back to work in Scientist, research (medical). Mother reported that she has [place], medicaid, food stamps and all the supplies needed for infant. CSW provided mother with a list of [facility] resources for substance abuse. CSW also made mother aware that a CPS report will be made if infant's cord tissue drug screen is positive. Mother verbalized her understanding and reported that she has been involved with CPS because of the "pot smoking." Mother reported that infant is now in the [facility] and will be here until at least next week. CSW provided emotional support. Mother and father of the infant reported no other needs or concerns at this time. CSW will continue to follow and assist as needed.    CSW Plan/Description:  CSW Will Continue to Monitor Umbilical Cord Tissue Drug Screen Results and Make Report if [facility], Lenice Llamas 05/04/2017, 4:33 PM

## 2017-05-05 LAB — CBC
HCT: 26.4 % — ABNORMAL LOW (ref 35.0–47.0)
Hemoglobin: 9.1 g/dL — ABNORMAL LOW (ref 12.0–16.0)
MCH: 31.2 pg (ref 26.0–34.0)
MCHC: 34.5 g/dL (ref 32.0–36.0)
MCV: 90.5 fL (ref 80.0–100.0)
PLATELETS: 188 10*3/uL (ref 150–440)
RBC: 2.92 MIL/uL — AB (ref 3.80–5.20)
RDW: 15 % — AB (ref 11.5–14.5)
WBC: 10.5 10*3/uL (ref 3.6–11.0)

## 2017-05-05 MED ORDER — SULFAMETHOXAZOLE-TRIMETHOPRIM 400-80 MG PO TABS: 1.0000 | ORAL_TABLET | Freq: Every day | ORAL | 0 refills | Status: DC

## 2017-05-05 MED ORDER — IBUPROFEN 600 MG PO TABS: 600.0000 mg | ORAL_TABLET | Freq: Four times a day (QID) | ORAL | 1 refills | Status: DC

## 2017-05-05 MED ORDER — NICOTINE 14 MG/24HR TD PT24: 14.0000 mg | MEDICATED_PATCH | Freq: Every day | TRANSDERMAL | 0 refills | Status: DC

## 2017-05-05 MED ORDER — OXYCODONE HCL 5 MG PO TABS: 5.0000 mg | ORAL_TABLET | Freq: Four times a day (QID) | ORAL | 0 refills | Status: DC | PRN

## 2017-05-05 NOTE — Progress Notes (Signed)
Dr. Feliberto GottronSchermerhorn saw patient this shift and removed abdominal dressing.  Patient complained of bleeding from sides of incision.  Telfa applied with paper tape per Dr. Francesca OmanSchermerhorn's instructions at bedside.  Patient also given education on changing foley bag from standard leg bag to large collection bag at bedside per Dr.'s instructions.  Patient also educated on changing Telfa.

## 2017-05-05 NOTE — Progress Notes (Signed)
Discharge instructions given. Patient verbalizes understanding of teaching. Cleansing package given. Patient to see infant in SCN and then requesting that partner wheel her to the car for discharge. Patient discharged with catheter and leg bag. Instructions given. Patient and partner demonstrate understanding of teaching. Patient discharged home via wheelchair at 1505.

## 2017-05-08 ENCOUNTER — Telehealth: Payer: Self-pay | Admitting: Urology

## 2017-05-08 NOTE — Telephone Encounter (Signed)
-----   Message from Riki AltesScott C Stoioff, MD sent at 05/04/2017  3:42 PM EST ----- Please schedule cystogram in approximately 10 days and follow-up appointment with me for catheter removal.

## 2017-05-08 NOTE — Telephone Encounter (Signed)
I have tried to call the patient but the number we have is not her's. I have scheduled the follow up but still trying to reach her to schedule to cystogram  [facility]

## 2017-05-09 NOTE — Telephone Encounter (Signed)
Yes I will I actually spoke with her this morning and transferred her over to scheduling to get this scheduled prior to her app. So she should be good to go for her app with you.  Thanks, Kim DusterMichelle

## 2017-05-09 NOTE — Telephone Encounter (Signed)
OK. Can you put a note not to remove catheter until cystogram done?

## 2017-05-14 ENCOUNTER — Encounter: Payer: Self-pay | Admitting: Emergency Medicine

## 2017-05-14 ENCOUNTER — Ambulatory Visit: Admission: RE | Admit: 2017-05-14 | Payer: Medicaid Other | Source: Ambulatory Visit

## 2017-05-14 ENCOUNTER — Telehealth: Payer: Self-pay | Admitting: Urology

## 2017-05-14 ENCOUNTER — Emergency Department
Admission: EM | Admit: 2017-05-14 | Discharge: 2017-05-14 | Disposition: A | Discharge status: Home / Self Care | Payer: Medicaid Other | Attending: Emergency Medicine | Admitting: Emergency Medicine

## 2017-05-14 ENCOUNTER — Emergency Department: Payer: Medicaid Other

## 2017-05-14 ENCOUNTER — Other Ambulatory Visit: Payer: Self-pay

## 2017-05-14 DIAGNOSIS — F17210 Nicotine dependence, cigarettes, uncomplicated: Secondary | ICD-10-CM | POA: Diagnosis not present

## 2017-05-14 DIAGNOSIS — T83098A Other mechanical complication of other indwelling urethral catheter, initial encounter: Secondary | ICD-10-CM | POA: Insufficient documentation

## 2017-05-14 DIAGNOSIS — R102 Pelvic and perineal pain: Secondary | ICD-10-CM | POA: Diagnosis present

## 2017-05-14 DIAGNOSIS — R339 Retention of urine, unspecified: Secondary | ICD-10-CM | POA: Insufficient documentation

## 2017-05-14 DIAGNOSIS — Y658 Other specified misadventures during surgical and medical care: Secondary | ICD-10-CM | POA: Insufficient documentation

## 2017-05-14 DIAGNOSIS — T839XXA Unspecified complication of genitourinary prosthetic device, implant and graft, initial encounter: Secondary | ICD-10-CM

## 2017-05-14 DIAGNOSIS — Z79899 Other long term (current) drug therapy: Secondary | ICD-10-CM | POA: Diagnosis not present

## 2017-05-14 LAB — URINALYSIS, COMPLETE (UACMP) WITH MICROSCOPIC
BILIRUBIN URINE: NEGATIVE
Glucose, UA: NEGATIVE mg/dL
KETONES UR: NEGATIVE mg/dL
Nitrite: NEGATIVE
Protein, ur: 100 mg/dL — AB
SPECIFIC GRAVITY, URINE: 1.029 (ref 1.005–1.030)
pH: 6 (ref 5.0–8.0)

## 2017-05-14 LAB — CBC
HEMATOCRIT: 31.5 % — AB (ref 35.0–47.0)
HEMOGLOBIN: 10.8 g/dL — AB (ref 12.0–16.0)
MCH: 32.1 pg (ref 26.0–34.0)
MCHC: 34.2 g/dL (ref 32.0–36.0)
MCV: 93.8 fL (ref 80.0–100.0)
PLATELETS: 647 10*3/uL — AB (ref 150–440)
RBC: 3.36 MIL/uL — AB (ref 3.80–5.20)
RDW: 15.5 % — ABNORMAL HIGH (ref 11.5–14.5)
WBC: 9.2 10*3/uL (ref 3.6–11.0)

## 2017-05-14 LAB — COMPREHENSIVE METABOLIC PANEL
ALT: 12 U/L — ABNORMAL LOW (ref 14–54)
ANION GAP: 9 (ref 5–15)
AST: 20 U/L (ref 15–41)
Albumin: 3.3 g/dL — ABNORMAL LOW (ref 3.5–5.0)
Alkaline Phosphatase: 58 U/L (ref 38–126)
BUN: 15 mg/dL (ref 6–20)
CO2: 22 mmol/L (ref 22–32)
Calcium: 8.6 mg/dL — ABNORMAL LOW (ref 8.9–10.3)
Chloride: 112 mmol/L — ABNORMAL HIGH (ref 101–111)
Creatinine, Ser: 0.75 mg/dL (ref 0.44–1.00)
Glucose, Bld: 118 mg/dL — ABNORMAL HIGH (ref 65–99)
POTASSIUM: 3.6 mmol/L (ref 3.5–5.1)
SODIUM: 143 mmol/L (ref 135–145)
Total Bilirubin: 0.5 mg/dL (ref 0.3–1.2)
Total Protein: 7.1 g/dL (ref 6.5–8.1)

## 2017-05-14 LAB — POCT PREGNANCY, URINE: PREG TEST UR: NEGATIVE

## 2017-05-14 MED ORDER — MORPHINE SULFATE (PF) 4 MG/ML IV SOLN
4.0000 mg | Freq: Once | INTRAVENOUS | Status: AC
  Administered 2017-05-14: 4 mg via INTRAVENOUS

## 2017-05-14 MED ORDER — ONDANSETRON HCL 4 MG/2ML IJ SOLN
INTRAMUSCULAR | Status: AC
  Administered 2017-05-14: 4 mg via INTRAVENOUS
  Filled 2017-05-14: qty 2

## 2017-05-14 MED ORDER — DOCUSATE SODIUM 100 MG PO CAPS: ORAL_CAPSULE | ORAL | 0 refills | Status: DC

## 2017-05-14 MED ORDER — ONDANSETRON HCL 4 MG/2ML IJ SOLN
4.0000 mg | INTRAMUSCULAR | Status: AC
  Administered 2017-05-14: 4 mg via INTRAVENOUS

## 2017-05-14 MED ORDER — OXYCODONE-ACETAMINOPHEN 5-325 MG PO TABS: 1.0000 | ORAL_TABLET | Freq: Three times a day (TID) | ORAL | 0 refills | Status: DC | PRN

## 2017-05-14 MED ORDER — MORPHINE SULFATE (PF) 4 MG/ML IV SOLN
INTRAVENOUS | Status: AC
  Administered 2017-05-14: 4 mg via INTRAVENOUS
  Filled 2017-05-14: qty 1

## 2017-05-14 MED ORDER — OXYCODONE-ACETAMINOPHEN 5-325 MG PO TABS
2.0000 | ORAL_TABLET | Freq: Once | ORAL | Status: AC
  Administered 2017-05-14: 2 via ORAL
  Filled 2017-05-14: qty 2

## 2017-05-14 MED ORDER — SODIUM CHLORIDE 0.9 % IV SOLN
INTRAVENOUS | Status: AC
  Administered 2017-05-14: 1 g via INTRAVENOUS
  Filled 2017-05-14: qty 10

## 2017-05-14 MED ORDER — SODIUM CHLORIDE 0.9 % IV SOLN
1.0000 g | INTRAVENOUS | Status: AC
  Administered 2017-05-14: 1 g via INTRAVENOUS

## 2017-05-14 NOTE — ED Provider Notes (Signed)
[facility] Emergency Department Provider Note  ____________________________________________   First MD Initiated Contact with Patient 05/14/17 [phone]     (approximate)  I have reviewed the triage vital signs and the nursing notes.   HISTORY  Chief Complaint Abdominal Pain    HPI Kim Kidd is a 32 y.o. female who has had 7 children (1 of whom subsequently passed away) and who recently had a cesarean section a couple of weeks ago who presents for evaluation of gradually worsening lower abdominal/pelvic pain over the last 2 weeks and Foley catheter issues.  In short, when she had her cesarean section, the OB/GYN provider nicked the bladder resulting in the need for an indwelling Foley catheter and urology follow-up which is scheduled in 2 days with Dr. Lonna Cobb in clinic.  However, the patient states that over the last 2 days she has had gradually worsening pain which is both sharp with movement and a burning pain, particularly when the urine leaks out from around the Foley catheter.  Nothing makes it better and moving around makes it worse.  She still has some urine draining into the bag which is dark and seems to have sediment, but the urine may be draining less than usual.  She feels terrible all over but has no other specific complaints.  She specifically denies fever/chills, chest pain, shortness of breath.  She has had some nausea but no vomiting.  She denies diarrhea.  Past Medical History:  Diagnosis Date  . History of blood transfusion   . History of preterm labor     Patient Active Problem List   Diagnosis Date Noted  . Postoperative state 05/02/2017  . Uterine contractions during pregnancy 05/01/2017  . Premature uterine contractions 04/13/2017  . False labor after 37 weeks of gestation without delivery 04/13/2017  . Abdominal pain 03/31/2017  . Insufficient prenatal care 03/07/2016  . 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 Postpartum care following  vaginal delivery 03/07/2016    Past Surgical History:  Procedure Laterality Date  . APPENDECTOMY    . BLADDER REPAIR N/A 05/02/2017   Procedure: BLADDER REPAIR;  Surgeon: Schermerhorn, Ihor Austin, MD;  Location: [facility] ORS;  Service: Obstetrics;  Laterality: N/A;  . CESAREAN SECTION N/A 05/02/2017   Procedure: CESAREAN SECTION;  Surgeon: Feliberto Gottron Ihor Austin, MD;  Location: [facility] ORS;  Service: Obstetrics;  Laterality: N/A;  . CHOLECYSTECTOMY    . NEPHRECTOMY Left     Prior to Admission medications   Medication Sig Start Date End Date Taking? Authorizing Provider  ibuprofen (ADVIL,MOTRIN) 600 MG tablet Take 1 tablet (600 mg total) by mouth every 6 (six) hours. 05/05/17  Yes Ward, Elenora Fender, MD  oxyCODONE (OXY IR/ROXICODONE) 5 MG immediate release tablet Take 1-2 tablets (5-10 mg total) by mouth every 6 (six) hours as needed for severe pain. 05/05/17  Yes Ward, Elenora Fender, MD  phenazopyridine (PYRIDIUM) 200 MG tablet Take 200 mg by mouth 3 (three) times daily as needed for pain.   Yes [provider]  sulfamethoxazole-trimethoprim (BACTRIM,SEPTRA) 400-80 MG tablet Take 1 tablet by mouth daily. 05/06/17  Yes Ward, Elenora Fender, MD  docusate sodium (COLACE) 100 MG capsule Take 1 tablet once or twice daily as needed for constipation while taking narcotic pain medicine 05/14/17   Loleta Rose, MD  nicotine (NICODERM CQ - DOSED IN MG/24 HOURS) 14 mg/24hr patch Place 1 patch (14 mg total) onto the skin daily. 05/06/17   Ward, Elenora Fender, MD  oxyCODONE-acetaminophen (PERCOCET) 5-325 MG tablet Take  1-2 tablets by mouth every 8 (eight) hours as needed for severe pain. 05/14/17   Loleta Rose, MD    Allergies Penicillins  Family History  Problem Relation Age of Onset  . Hypertension Maternal Grandmother   . Cancer Paternal Grandmother   . Cancer Paternal Grandfather     Social History Social History   Tobacco Use  . Smoking status: Current Some Day Smoker    Packs/day: 1.00    Years: 15.00      Pack years: 15.00    Types: Cigarettes  . Smokeless tobacco: Never Used  Substance Use Topics  . Alcohol use: No  . Drug use: No    Review of Systems Constitutional: No fever/chills Eyes: No visual changes. ENT: No sore throat. Cardiovascular: Denies chest pain. Respiratory: Denies shortness of breath. Gastrointestinal: Suprapubic/pelvic pain.  nausea, no vomiting.  No diarrhea.  No constipation. Genitourinary: Liquid is been leaking from around the Foley catheter and burns.  She is also having pelvic pain as described above Musculoskeletal: Negative for neck pain.  Negative for back pain. Integumentary: Negative for rash. Neurological: Negative for headaches, focal weakness or numbness.   ____________________________________________   PHYSICAL EXAM:  VITAL SIGNS: ED Triage Vitals  Enc Vitals Group     BP 05/14/17 0133 126/69     Pulse Rate 05/14/17 0133 100     Resp 05/14/17 0133 18     Temp 05/14/17 0133 98.7 F (37.1 C)     Temp Source 05/14/17 0133 Oral     SpO2 05/14/17 0133 98 %     Weight 05/14/17 0134 70.3 kg (155 lb)     Height 05/14/17 0134 1.702 m (5\' 7" )     Head Circumference --      Peak Flow --      Pain Score 05/14/17 0133 9     Pain Loc --      Pain Edu? --      Excl. in GC? --     Constitutional: Alert and oriented. Well appearing and in no acute distress although she does appear uncomfortable Eyes: Conjunctivae are normal.  Head: Atraumatic. Nose: No congestion/rhinnorhea. Mouth/Throat: Mucous membranes are moist. Neck: No stridor.  No meningeal signs.   Cardiovascular: Normal rate, regular rhythm. Good peripheral circulation. Grossly normal heart sounds. Respiratory: Normal respiratory effort.  No retractions. Lungs CTAB. Gastrointestinal: Soft with diffuse tenderness to palpation throughout her abdomen.  Well-appearing and well-healing C-section scars Musculoskeletal: No lower extremity tenderness nor edema. No gross deformities of  extremities. Neurologic:  Normal speech and language. No gross focal neurologic deficits are appreciated.  Skin:  Skin is warm, dry and intact. No rash noted. Psychiatric: Mood and affect are normal. Speech and behavior are normal.  ____________________________________________   LABS (all labs ordered are listed, but only abnormal results are displayed)  Labs Reviewed  COMPREHENSIVE METABOLIC PANEL - Abnormal; Notable for the following components:      Result Value   Chloride 112 (*)    Glucose, Bld 118 (*)    Calcium 8.6 (*)    Albumin 3.3 (*)    ALT 12 (*)    All other components within normal limits  CBC - Abnormal; Notable for the following components:   RBC 3.36 (*)    Hemoglobin 10.8 (*)    HCT 31.5 (*)    RDW 15.5 (*)    Platelets 647 (*)    All other components within normal limits  URINALYSIS, COMPLETE (UACMP) WITH MICROSCOPIC - Abnormal; Notable  for the following components:   Color, Urine AMBER (*)    APPearance CLOUDY (*)    Hgb urine dipstick LARGE (*)    Protein, ur 100 (*)    Leukocytes, UA TRACE (*)    Bacteria, UA RARE (*)    Squamous Epithelial / LPF 0-5 (*)    All other components within normal limits  URINE CULTURE  POC URINE PREG, ED  POCT PREGNANCY, URINE   ____________________________________________  EKG  None - EKG not ordered by ED physician ____________________________________________  RADIOLOGY   ED MD interpretation: Per radiology report, there is an abnormal appearance of the bladder with a loculated fluid collection outside the bladder and bladder distention in spite of Foley catheter placement  Official radiology report(s): Ct Renal Stone Study  Result Date: 05/14/2017 CLINICAL DATA:  32 year old female with recent C-section and reported bladder injury during C-section. Lower abdominal pain and burning. EXAM: CT ABDOMEN AND PELVIS WITHOUT CONTRAST TECHNIQUE: Multidetector CT imaging of the abdomen and pelvis was performed following  the standard protocol without IV contrast. COMPARISON:  Abdominal radiograph dated 05/02/2017 MR sound dated 11/16/2016 FINDINGS: Lower chest: The visualized lung bases are clear. No intra-abdominal free air. Diffuse mesenteric stranding and small free fluid within the pelvis. Hepatobiliary: The liver is unremarkable. No intrahepatic biliary ductal dilatation. Cholecystectomy. Pancreas: Unremarkable. No pancreatic ductal dilatation or surrounding inflammatory changes. Spleen: Normal in size without focal abnormality. Adrenals/Urinary Tract: The adrenal glands are unremarkable. There is a solitary right kidney. Multiple punctate nonobstructing right renal calculi noted. There is no hydronephrosis. The urinary bladder is partially distended despite presence of a Foley catheter. There is indentation and irregularity of the right bladder wall. A 2.1 x 2.8 cm low attenuating ovoid structure to the right of the bladder along the area of irregularity may represent a loculated fluid collection or focal protrusion of the bladder wall. Stomach/Bowel: There is no bowel obstruction or active inflammation. Appendectomy. Vascular/Lymphatic: Abdominal aorta IVC are grossly unremarkable. No portal venous gas. There is no adenopathy. Reproductive: The uterus is anteverted. The uterus is enlarged, likely related to postpartum status. High attenuating content within the endometrium likely represents blood product or proteinaceous content appears to extend to the lower anterior uterus at the C-section incision. The left ovary is grossly unremarkable. The right ovary is not well visualized. Other: Anterior pelvic wall C-section incision and inflammatory changes. No drainable fluid collection. Musculoskeletal: No acute or significant osseous findings. IMPRESSION: 1. Postpartum appearance of the uterus. Small amount of high attenuating content within the endometrium most likely represents blood product. 2. Irregular appearance of the  right bladder wall. This is not well evaluated on this noncontrast CT. A small loculated fluid collection is noted to the right of the bladder. Bladder is distended despite presence of a Foley catheter. 3. Solitary right kidney with multiple punctate nonobstructing calculi. No hydronephrosis. Electronically Signed   By: Elgie CollardArash  Radparvar M.D.   On: 05/14/2017 05:12    ____________________________________________   PROCEDURES  Critical Care performed: No   Procedure(s) performed:   Procedures   ____________________________________________   INITIAL IMPRESSION / ASSESSMENT AND PLAN / ED COURSE  As part of my medical decision making, I reviewed the following data within the electronic MEDICAL RECORD NUMBER Nursing notes reviewed and incorporated, Labs reviewed , Old chart reviewed, A consult was requested and obtained from this/these consultant(s) (urology)  and Notes from prior ED visits, [place] controlled substance database reviewed    Differential diagnosis includes, but is not limited  to, UTI/pyelonephritis, kidney stone, Foley catheter malfunction/obstruction, less likely pelvic or uterine issue associated with recent C-section.  The patient seems to mostly have symptoms and around her urethra and associated with a Foley catheter.  She has obvious hematuria in the bag although there is a relatively small volume of urine in the bag.  Lab work is generally reassuring with no leukocytosis, and no evidence of kidney dysfunction, and blood in the urine.  Urine culture is pending.  I have ordered a CT renal stone protocol to evaluate the lower abdomen for any signs of abscess or fluid collection as well as evaluating the urinary bladder, Foley catheter, etc.  I have provided analgesia and antiemetics.  I will touch base with urology once the imaging is back.   Clinical Course as of May 14 834  Mon May 14, 2017  9811 I spoke with phone with Dr. Mena Goes on 2 separate occasions to discuss the CT scan.   He felt like there is an obstruction of the catheter which we may be able to relieve with aspiration on the catheter or with a small amount (5-10 mL) of normal saline flush followed by gentle operation.  He also suggested pulling back the catheter slightly because it seems to be a little bit deep in the bladder.  I discussed this with Danelle Earthly her nurse who will attempt to clear the Foley.  Dr. Mena Goes did state that if the Foley is not working adequately we can replace it with a new one.  [CF]  [phone] Dr. Mena Goes called back to ask how the Foley went.  I discussed it with Danelle Earthly, who informed me that he was able to aspirate about 250 mL of urine after which the Foley started draining more readily.  There is a lot of sediment in the bag.  Dr. Mena Goes was reassured by this.  He said that he would let Dr. Lonna Cobb know the patient is in the emergency department and he will asked Dr. Lonna Cobb to come to the ED to evaluate the patient in person.  This will also allow for a couple of hours of observation in the ED to make sure that the urine will continue to drain.  I will inform my daytime colleague, Dr. Sharma Covert, about the plan for signout and transferred care.  Patient remains hemodynamically stable.  She continues to report a great deal of pain and I will give 1 more dose of pain medicine, this time as Percocet by mouth so that they will last longer.  She does report, however, that she feels better after the bladder was drained  [CF]  0744 Dr. Lonna Cobb evaluated the patient in person in the emergency department.  He felt that outpatient follow-up was appropriate.  I checked the drug database and she had multiple narcotics prescriptions last year, but only two recently from her OB/GYN.  I will provide one additional prescription to help her through the acute pain.  She knows to follow up with Dr. Lonna Cobb as planned.    I gave my usual and customary return precautions.   [CF]    Clinical Course User Index [CF] Loleta Rose, MD    ____________________________________________  FINAL CLINICAL IMPRESSION(S) / ED DIAGNOSES  Final diagnoses:  Suprapubic pain  Problem with Foley catheter, initial encounter [facility])  Urinary retention     MEDICATIONS GIVEN DURING THIS VISIT:  Medications  morphine 4 MG/ML injection 4 mg (4 mg Intravenous Given 05/14/17 0419)  ondansetron (ZOFRAN) injection 4 mg (4 mg  Intravenous Given 05/14/17 0417)  cefTRIAXone (ROCEPHIN) 1 g in sodium chloride 0.9 % 100 mL IVPB (0 g Intravenous Stopped 05/14/17 0450)  morphine 4 MG/ML injection 4 mg (4 mg Intravenous Given 05/14/17 0601)  oxyCODONE-acetaminophen (PERCOCET/ROXICET) 5-325 MG per tablet 2 tablet (2 tablets Oral Given 05/14/17 0735)     ED Discharge Orders        Ordered    oxyCODONE-acetaminophen (PERCOCET) 5-325 MG tablet  Every 8 hours PRN     05/14/17 0748    docusate sodium (COLACE) 100 MG capsule     05/14/17 0748       Note:  This document was prepared using Dragon voice recognition software and may include unintentional dictation errors.    Loleta Rose, MD 05/14/17 [phone]

## 2017-05-14 NOTE — Consult Note (Signed)
32 year old female status post C-section on 05/02/2017 with inadvertent bladder laceration who underwent cystotomy repair.  She is scheduled for a cystogram tomorrow and catheter removal on 2/27.  She presented to the ED early this morning with urinary retention.  She had no hematuria.  260 mL of clear urine was obtained on aspiration with a [place] syringe.  She is feeling much better.  Exam: The Foley catheter is draining clear urine.  Impression: Obstructed Foley catheter which may have been secondary to mucous.  Recommendation: Keep cystogram appointment tomorrow.  Contact our office for decreased urine drainage from her catheter or recurrent lower abdominal symptoms.

## 2017-05-14 NOTE — ED Notes (Signed)
Pelvic cart to bedside 

## 2017-05-14 NOTE — ED Notes (Signed)
Pt reports C-section on the 13 Feb, "Bladder got nicked" and foley indwelling (<16600mls amber/orange urine in bag)  Pt c/o terrible 9/10 burning pain in vagina for the last 2 days, pt has been taken prescribed "large white pill" as an ABX and pyridium

## 2017-05-14 NOTE — ED Notes (Signed)
260 mls amber cloudy urine removed from foley with suction and drain to gravity, unable to position foley away from pt also reflected severe pain with any movement  Area cleaned and foley reattached to drainage bag, pt reports "feeling of having to pee less"

## 2017-05-14 NOTE — ED Triage Notes (Signed)
Patient states that she had an caesarian section on 05/02/17. Patient states that her bladder was nicked during surgery. Patient states that she had a foley catheter placed at that time and was told they would take it out Wednesday. Patient states that she started having burning with the catheter and lower abdominal two days ago. Patient states that the pain become worse.

## 2017-05-14 NOTE — Telephone Encounter (Signed)
App made ° ° °Kim Kidd  °

## 2017-05-14 NOTE — Discharge Instructions (Signed)
You were seen in the Emergency Department (ED) for urinary retention with an indwelling Foley catheter. We flushed it and it is now working properly, and you were seen in the ED by Dr. Lonna CobbStoioff who recommends following up as planned.  Please read through the included information and follow up with your doctor as recommended in these papers.  If you stop producing urine in the bag or if you develop other symptoms that concern you, such as fever, chills, persistent vomiting, or severe abdominal pain, please return immediately to the Emergency Department.   Take Percocet as prescribed for severe pain. Do not drink alcohol, drive or participate in any other potentially dangerous activities while taking this medication as it may make you sleepy. Do not take this medication with any other sedating medications, either prescription or over-the-counter. If you were prescribed Percocet or Vicodin, do not take these with acetaminophen (Tylenol) as it is already contained within these medications.   This medication is an opiate (or narcotic) pain medication and can be habit forming.  Use it as little as possible to achieve adequate pain control.  Do not use or use it with extreme caution if you have a history of opiate abuse or dependence.  If you are on a pain contract with your primary care doctor or a pain specialist, be sure to let them know you were prescribed this medication today from the [facility].  This medication is intended for your use only - do not give any to anyone else and keep it in a secure place where nobody else, especially children, have access to it.  It will also cause or worsen constipation, so you may want to consider taking an over-the-counter stool softener while you are taking this medication.

## 2017-05-14 NOTE — ED Notes (Signed)
Pt ambulatory to wheel chair upon discharge. Verbalized understanding of discharge instructions, follow-up care and prescriptions. VSS. Skin warm and dry. A&O x4.  

## 2017-05-15 LAB — URINE CULTURE: Culture: NO GROWTH

## 2017-05-16 ENCOUNTER — Ambulatory Visit (INDEPENDENT_AMBULATORY_CARE_PROVIDER_SITE_OTHER): Payer: Medicaid Other

## 2017-05-16 VITALS — BP 99/66 | HR 113 | Ht 67.0 in | Wt 148.0 lb

## 2017-05-16 DIAGNOSIS — Z9889 Other specified postprocedural states: Secondary | ICD-10-CM

## 2017-05-16 NOTE — Progress Notes (Signed)
Per Dr. Lonna CobbStoioff pt could not have foley removed today due to not having cystogram performed yet. Due to foley becoming obstructed foley was exchanged. 3914fr foley removed and 618fr replaced. Pt tolerated well. Pt will have cystogram tomorrow and RTC Friday for foley removal. Pt voiced understanding  Blood pressure 99/66, pulse (!) 113, height 5\' 7"  (1.702 m), weight 148 lb (67.1 kg), unknown if currently breastfeeding.

## 2017-05-17 ENCOUNTER — Ambulatory Visit
Admission: RE | Admit: 2017-05-17 | Discharge: 2017-05-17 | Disposition: A | Discharge status: Home / Self Care | Payer: Medicaid Other | Source: Ambulatory Visit | Attending: Urology | Admitting: Urology

## 2017-05-17 DIAGNOSIS — S3720XD Unspecified injury of bladder, subsequent encounter: Secondary | ICD-10-CM | POA: Diagnosis present

## 2017-05-17 DIAGNOSIS — Z9889 Other specified postprocedural states: Secondary | ICD-10-CM | POA: Diagnosis not present

## 2017-05-17 DIAGNOSIS — X58XXXD Exposure to other specified factors, subsequent encounter: Secondary | ICD-10-CM | POA: Diagnosis not present

## 2017-05-17 LAB — POCT PREGNANCY, URINE: PREG TEST UR: NEGATIVE

## 2017-05-17 MED ORDER — IOTHALAMATE MEGLUMINE 17.2 % UR SOLN
300.0000 mL | Freq: Once | URETHRAL | Status: AC | PRN
  Administered 2017-05-17: 300 mL via INTRAVESICAL

## 2017-05-18 ENCOUNTER — Ambulatory Visit (INDEPENDENT_AMBULATORY_CARE_PROVIDER_SITE_OTHER): Payer: Medicaid Other

## 2017-05-18 ENCOUNTER — Telehealth: Payer: Self-pay

## 2017-05-18 VITALS — BP 110/60 | HR 85 | Ht 67.0 in | Wt 148.0 lb

## 2017-05-18 DIAGNOSIS — Z9889 Other specified postprocedural states: Secondary | ICD-10-CM | POA: Diagnosis not present

## 2017-05-18 NOTE — Progress Notes (Signed)
Catheter Removal  Patient is present today for a catheter removal.  10ml of water was drained from the balloon. A 18FR foley cath was removed from the bladder no complications were noted . Patient tolerated well.  Preformed by: Rupert Stackshelsea Watkins, LPN   Follow up/ Additional notes: pt will f/u in 6 weeks with Dr. Lonna CobbStoioff.   Blood pressure 110/60, pulse 85, height 5\' 7"  (1.702 m), weight 148 lb (67.1 kg), unknown if currently breastfeeding.

## 2017-05-18 NOTE — Telephone Encounter (Signed)
-----   Message from Riki AltesScott C Stoioff, MD sent at 05/17/2017  1:56 PM EST ----- Cystogram was normal and showed no extravasation.  Her Foley catheter can be removed tomorrow.  Schedule 6-week follow-up with me.

## 2017-06-14 ENCOUNTER — Ambulatory Visit: Payer: Medicaid Other | Admitting: Urology

## 2017-06-14 ENCOUNTER — Encounter: Payer: Self-pay | Admitting: Urology

## 2017-06-30 ENCOUNTER — Other Ambulatory Visit: Payer: Self-pay

## 2017-06-30 ENCOUNTER — Emergency Department
Admission: EM | Admit: 2017-06-30 | Discharge: 2017-06-30 | Disposition: A | Discharge status: Home / Self Care | Payer: Medicaid Other | Attending: Emergency Medicine | Admitting: Emergency Medicine

## 2017-06-30 ENCOUNTER — Encounter: Payer: Self-pay | Admitting: Emergency Medicine

## 2017-06-30 ENCOUNTER — Emergency Department: Payer: Medicaid Other

## 2017-06-30 DIAGNOSIS — F17210 Nicotine dependence, cigarettes, uncomplicated: Secondary | ICD-10-CM | POA: Diagnosis not present

## 2017-06-30 DIAGNOSIS — R1031 Right lower quadrant pain: Secondary | ICD-10-CM | POA: Diagnosis present

## 2017-06-30 DIAGNOSIS — K529 Noninfective gastroenteritis and colitis, unspecified: Secondary | ICD-10-CM

## 2017-06-30 DIAGNOSIS — Z79899 Other long term (current) drug therapy: Secondary | ICD-10-CM | POA: Diagnosis not present

## 2017-06-30 LAB — COMPREHENSIVE METABOLIC PANEL
ALBUMIN: 4.1 g/dL (ref 3.5–5.0)
ALK PHOS: 41 U/L (ref 38–126)
ALT: 18 U/L (ref 14–54)
ANION GAP: 5 (ref 5–15)
AST: 19 U/L (ref 15–41)
BUN: 12 mg/dL (ref 6–20)
CALCIUM: 8.8 mg/dL — AB (ref 8.9–10.3)
CO2: 27 mmol/L (ref 22–32)
Chloride: 105 mmol/L (ref 101–111)
Creatinine, Ser: 0.8 mg/dL (ref 0.44–1.00)
GFR calc non Af Amer: >60 mL/min (ref >60)
Glucose, Bld: 105 mg/dL — ABNORMAL HIGH (ref 65–99)
Potassium: 3.3 mmol/L — ABNORMAL LOW (ref 3.5–5.1)
SODIUM: 137 mmol/L (ref 135–145)
Total Bilirubin: 0.7 mg/dL (ref 0.3–1.2)
Total Protein: 7.6 g/dL (ref 6.5–8.1)

## 2017-06-30 LAB — CBC
HEMATOCRIT: 37.9 % (ref 35.0–47.0)
HEMOGLOBIN: 12.5 g/dL (ref 12.0–16.0)
MCH: 30 pg (ref 26.0–34.0)
MCHC: 33 g/dL (ref 32.0–36.0)
MCV: 90.9 fL (ref 80.0–100.0)
Platelets: 241 10*3/uL (ref 150–440)
RBC: 4.17 MIL/uL (ref 3.80–5.20)
RDW: 15 % — ABNORMAL HIGH (ref 11.5–14.5)
WBC: 6.3 10*3/uL (ref 3.6–11.0)

## 2017-06-30 LAB — URINALYSIS, COMPLETE (UACMP) WITH MICROSCOPIC
BACTERIA UA: NONE SEEN
Bilirubin Urine: NEGATIVE
GLUCOSE, UA: NEGATIVE mg/dL
HGB URINE DIPSTICK: NEGATIVE
KETONES UR: NEGATIVE mg/dL
Leukocytes, UA: NEGATIVE
NITRITE: NEGATIVE
PROTEIN: NEGATIVE mg/dL
Specific Gravity, Urine: 1.012 (ref 1.005–1.030)
pH: 8 (ref 5.0–8.0)

## 2017-06-30 LAB — LIPASE, BLOOD: LIPASE: 32 U/L (ref 11–51)

## 2017-06-30 LAB — POCT PREGNANCY, URINE: Preg Test, Ur: NEGATIVE

## 2017-06-30 MED ORDER — MORPHINE SULFATE (PF) 4 MG/ML IV SOLN
4.0000 mg | Freq: Once | INTRAVENOUS | Status: AC
  Administered 2017-06-30: 4 mg via INTRAVENOUS
  Filled 2017-06-30: qty 1

## 2017-06-30 MED ORDER — CIPROFLOXACIN HCL 500 MG PO TABS
500.0000 mg | ORAL_TABLET | Freq: Once | ORAL | Status: AC
  Administered 2017-06-30: 500 mg via ORAL
  Filled 2017-06-30: qty 1

## 2017-06-30 MED ORDER — METRONIDAZOLE 500 MG PO TABS
500.0000 mg | ORAL_TABLET | Freq: Once | ORAL | Status: AC
  Administered 2017-06-30: 500 mg via ORAL
  Filled 2017-06-30: qty 1

## 2017-06-30 MED ORDER — IOPAMIDOL (ISOVUE-300) INJECTION 61%
100.0000 mL | Freq: Once | INTRAVENOUS | Status: AC | PRN
  Administered 2017-06-30: 100 mL via INTRAVENOUS

## 2017-06-30 MED ORDER — KETOROLAC TROMETHAMINE 30 MG/ML IJ SOLN
15.0000 mg | Freq: Once | INTRAMUSCULAR | Status: AC
Start: 2017-06-30 — End: 2017-06-30
  Administered 2017-06-30: 15 mg via INTRAVENOUS
  Filled 2017-06-30: qty 1

## 2017-06-30 MED ORDER — OXYCODONE-ACETAMINOPHEN 5-325 MG PO TABS: 1.0000 | ORAL_TABLET | ORAL | 0 refills | Status: DC | PRN

## 2017-06-30 MED ORDER — ONDANSETRON HCL 4 MG/2ML IJ SOLN
4.0000 mg | Freq: Once | INTRAMUSCULAR | Status: AC
  Administered 2017-06-30: 4 mg via INTRAVENOUS
  Filled 2017-06-30: qty 2

## 2017-06-30 MED ORDER — CIPROFLOXACIN HCL 500 MG PO TABS: 500.0000 mg | ORAL_TABLET | Freq: Two times a day (BID) | ORAL | 0 refills | Status: AC

## 2017-06-30 MED ORDER — METRONIDAZOLE 500 MG PO TABS: 500.0000 mg | ORAL_TABLET | Freq: Three times a day (TID) | ORAL | 0 refills | Status: AC

## 2017-06-30 NOTE — Discharge Instructions (Signed)
Take both of your antibiotics as prescribed for a full 10 days and make an appointment to follow-up with your primary care physician within the coming week for reevaluation.  Return to the emergency department sooner for any new or worsening symptoms such as fevers, chills, if you cannot eat or drink, or for any other issues whatsoever.  It was a pleasure to take care of you today, and thank you for coming to our emergency department.  If you have any questions or concerns before leaving please ask the nurse to grab me and I'm more than happy to go through your aftercare instructions again.  If you were prescribed any opioid pain medication today such as Norco, Vicodin, Percocet, morphine, hydrocodone, or oxycodone please make sure you do not drive when you are taking this medication as it can alter your ability to drive safely.  If you have any concerns once you are home that you are not improving or are in fact getting worse before you can make it to your follow-up appointment, please do not hesitate to call 911 and come back for further evaluation.  Merrily Brittle, MD  Results for orders placed or performed during the hospital encounter of 06/30/17  Lipase, blood  Result Value Ref Range   Lipase 32 11 - 51 U/L  Comprehensive metabolic panel  Result Value Ref Range   Sodium 137 135 - 145 mmol/L   Potassium 3.3 (L) 3.5 - 5.1 mmol/L   Chloride 105 101 - 111 mmol/L   CO2 27 22 - 32 mmol/L   Glucose, Bld 105 (H) 65 - 99 mg/dL   BUN 12 6 - 20 mg/dL   Creatinine, Ser 1.61 0.44 - 1.00 mg/dL   Calcium 8.8 (L) 8.9 - 10.3 mg/dL   Total Protein 7.6 6.5 - 8.1 g/dL   Albumin 4.1 3.5 - 5.0 g/dL   AST 19 15 - 41 U/L   ALT 18 14 - 54 U/L   Alkaline Phosphatase 41 38 - 126 U/L   Total Bilirubin 0.7 0.3 - 1.2 mg/dL   GFR calc non Af Amer >60 >60 mL/min   GFR calc Af Amer >60 >60 mL/min   Anion gap 5 5 - 15  CBC  Result Value Ref Range   WBC 6.3 3.6 - 11.0 K/uL   RBC 4.17 3.80 - 5.20 MIL/uL   Hemoglobin 12.5 12.0 - 16.0 g/dL   HCT 09.6 04.5 - 40.9 %   MCV 90.9 80.0 - 100.0 fL   MCH 30.0 26.0 - 34.0 pg   MCHC 33.0 32.0 - 36.0 g/dL   RDW 81.1 (H) 91.4 - 78.2 %   Platelets 241 150 - 440 K/uL  Urinalysis, Complete w Microscopic  Result Value Ref Range   Color, Urine YELLOW (A) YELLOW   APPearance HAZY (A) CLEAR   Specific Gravity, Urine 1.012 1.005 - 1.030   pH 8.0 5.0 - 8.0   Glucose, UA NEGATIVE NEGATIVE mg/dL   Hgb urine dipstick NEGATIVE NEGATIVE   Bilirubin Urine NEGATIVE NEGATIVE   Ketones, ur NEGATIVE NEGATIVE mg/dL   Protein, ur NEGATIVE NEGATIVE mg/dL   Nitrite NEGATIVE NEGATIVE   Leukocytes, UA NEGATIVE NEGATIVE   RBC / HPF 0-5 0 - 5 RBC/hpf   WBC, UA 0-5 0 - 5 WBC/hpf   Bacteria, UA NONE SEEN NONE SEEN   Squamous Epithelial / LPF 0-5 (A) NONE SEEN   Mucus PRESENT   Pregnancy, urine POC  Result Value Ref Range   Preg Test, Ur  NEGATIVE NEGATIVE   Ct Abdomen Pelvis W Contrast  Result Date: 06/30/2017 CLINICAL DATA:  Right lower quadrant pain and nausea and vomiting for 2 days. Previous appendectomy, cholecystectomy, and left nephrectomy. Two months postpartum from C-section delivery and bladder injury repair. EXAM: CT ABDOMEN AND PELVIS WITH CONTRAST TECHNIQUE: Multidetector CT imaging of the abdomen and pelvis was performed using the standard protocol following bolus administration of intravenous contrast. CONTRAST:  100mL ISOVUE-300 IOPAMIDOL (ISOVUE-300) INJECTION 61% COMPARISON:  05/14/2017 FINDINGS: Lower Chest: Mild dependent atelectasis in both lung bases. Hepatobiliary: 1.5 cm low-attenuation lesion in segment 2 of the left lobe has nonspecific characteristics but is stable since recent CT. A 9 mm hypervascular nodule is seen in the anterior right hepatic lobe on image 11/2, which was not visualized on previous unenhanced exam. Prior cholecystectomy. No evidence of biliary obstruction. Pancreas:  No mass or inflammatory changes. Spleen: Within normal limits in  size and appearance. Adrenals/Urinary Tract: Normal appearance of adrenal glands and right kidney prior left nephrectomy. Bladder is nondistended and decreased wall thickening is seen along the right lateral bladder wall, likely at site of previous bladder injury. Stomach/Bowel: No evidence of bowel obstruction. Mild wall thickening and pericolonic soft tissue stranding seen along the descending and sigmoid colon, suspicious for mild colitis. No evidence of abscess. Vascular/Lymphatic: No pathologically enlarged lymph nodes. No abdominal aortic aneurysm. Reproductive: Unremarkable appearance of uterus, which is significantly decreased in size since previous study. No adnexal mass identified. Small amount of free fluid in pelvic cul-de-sac. Other:  None. Musculoskeletal:  No suspicious bone lesions identified. IMPRESSION: Probable mild colitis involving the descending and sigmoid colon. Small amount of free fluid in pelvic cul-de-sac. No evidence of abscess. Nonspecific 1.5 cm low-attenuation lesion in left hepatic lobe remains stable since recent CT. 9 mm hypervascular nodule in the anterior right lobe is also nonspecific. Nonemergent outpatient abdomen MRI without and with contrast is recommended for further characterization. Electronically Signed   By: Myles RosenthalJohn  Stahl M.D.   On: 06/30/2017 18:06

## 2017-06-30 NOTE — ED Triage Notes (Signed)
RLQ pain x 2 days.  Last night had some N/V and pain worsened this morning.  Pain worse with cough and movement

## 2017-06-30 NOTE — ED Provider Notes (Signed)
[facility] Emergency Department Provider Note  ____________________________________________   First MD Initiated Contact with Patient 06/30/17 1512     (approximate)  I have reviewed the triage vital signs and the nursing notes.   HISTORY  Chief Complaint Abdominal Pain   HPI Kim Kidd is a 32 y.o. female who comes to the emergency department with 2 days of right lower quadrant pain associated with nausea and vomiting.  Pain is been gradual onset slowly progressive is now constant.  It is currently severe.  Is worse with coughing and any movement.  She has had several loose stools.  She has a remote surgical history of appendectomy and cholecystectomy in about 6 weeks ago had a cesarean section.  She had been improving nicely after the C-section until 2 days ago.  She is not currently breast-feeding.  She denies fevers or chills.  Her pain is improved when lying still.  Past Medical History:  Diagnosis Date  . History of blood transfusion   . History of preterm labor     Patient Active Problem List   Diagnosis Date Noted  . Postoperative state 05/02/2017  . Uterine contractions during pregnancy 05/01/2017  . Premature uterine contractions 04/13/2017  . False labor after 37 weeks of gestation without delivery 04/13/2017  . Abdominal pain 03/31/2017  . Insufficient prenatal care 03/07/2016  . 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 Postpartum care following vaginal delivery 03/07/2016    Past Surgical History:  Procedure Laterality Date  . APPENDECTOMY    . BLADDER REPAIR N/A 05/02/2017   Procedure: BLADDER REPAIR;  Surgeon: Schermerhorn, Ihor Austinhomas J, MD;  Location: [facility] ORS;  Service: Obstetrics;  Laterality: N/A;  . CESAREAN SECTION N/A 05/02/2017   Procedure: CESAREAN SECTION;  Surgeon: Feliberto GottronSchermerhorn, Ihor Austinhomas J, MD;  Location: [facility] ORS;  Service: Obstetrics;  Laterality: N/A;  . CHOLECYSTECTOMY    . NEPHRECTOMY Left     Prior to Admission medications    Medication Sig Start Date End Date Taking? Authorizing Provider  ciprofloxacin (CIPRO) 500 MG tablet Take 1 tablet (500 mg total) by mouth 2 (two) times daily for 10 days. 06/30/17 07/10/17  Merrily Brittleifenbark, Tymel Conely, MD  docusate sodium (COLACE) 100 MG capsule Take 1 tablet once or twice daily as needed for constipation while taking narcotic pain medicine 05/14/17   Loleta RoseForbach, Cory, MD  ibuprofen (ADVIL,MOTRIN) 600 MG tablet Take 1 tablet (600 mg total) by mouth every 6 (six) hours. 05/05/17   Ward, Elenora Fenderhelsea C, MD  metroNIDAZOLE (FLAGYL) 500 MG tablet Take 1 tablet (500 mg total) by mouth 3 (three) times daily for 10 days. 06/30/17 07/10/17  Merrily Brittleifenbark, Nashya Garlington, MD  nicotine (NICODERM CQ - DOSED IN MG/24 HOURS) 14 mg/24hr patch Place 1 patch (14 mg total) onto the skin daily. 05/06/17   Ward, Elenora Fenderhelsea C, MD  oxyCODONE (OXY IR/ROXICODONE) 5 MG immediate release tablet Take 1-2 tablets (5-10 mg total) by mouth every 6 (six) hours as needed for severe pain. 05/05/17   Ward, Elenora Fenderhelsea C, MD  oxyCODONE-acetaminophen (PERCOCET/ROXICET) 5-325 MG tablet Take 1 tablet by mouth every 4 (four) hours as needed for severe pain. 06/30/17   Merrily Brittleifenbark, Akaylah Lalley, MD  phenazopyridine (PYRIDIUM) 200 MG tablet Take 200 mg by mouth 3 (three) times daily as needed for pain.    [provider]  sulfamethoxazole-trimethoprim (BACTRIM,SEPTRA) 400-80 MG tablet Take 1 tablet by mouth daily. 05/06/17   Ward, Elenora Fenderhelsea C, MD    Allergies Penicillins  Family History  Problem Relation Age of Onset  .  Hypertension Maternal Grandmother   . Cancer Paternal Grandmother   . Cancer Paternal Grandfather     Social History Social History   Tobacco Use  . Smoking status: Current Some Day Smoker    Packs/day: 1.00    Years: 15.00    Pack years: 15.00    Types: Cigarettes  . Smokeless tobacco: Never Used  Substance Use Topics  . Alcohol use: No  . Drug use: No    Review of Systems Constitutional: No fever/chills Eyes: No visual changes. ENT:  No sore throat. Cardiovascular: Denies chest pain. Respiratory: Denies shortness of breath. Gastrointestinal: Positive for abdominal pain.  Positive for nausea, positive for vomiting.  Positive for diarrhea.  No constipation. Genitourinary: Negative for dysuria. Musculoskeletal: Negative for back pain. Skin: Negative for rash. Neurological: Negative for headaches, focal weakness or numbness.   ____________________________________________   PHYSICAL EXAM:  VITAL SIGNS: ED Triage Vitals  Enc Vitals Group     BP 06/30/17 1420 105/75     Pulse Rate 06/30/17 1420 74     Resp 06/30/17 1419 16     Temp 06/30/17 1419 98.3 F (36.8 C)     Temp Source 06/30/17 1419 Oral     SpO2 06/30/17 1420 99 %     Weight 06/30/17 1417 150 lb (68 kg)     Height 06/30/17 1417 5\' 7"  (1.702 m)     Head Circumference --      Peak Flow --      Pain Score 06/30/17 1417 7     Pain Loc --      Pain Edu? --      Excl. in GC? --     Constitutional: Alert and oriented x4 appears quite uncomfortable holding her lower abdomen grimacing in pain Eyes: PERRL EOMI. Head: Atraumatic. Nose: No congestion/rhinnorhea. Mouth/Throat: No trismus Neck: No stridor.   Cardiovascular: Normal rate, regular rhythm. Grossly normal heart sounds.  Good peripheral circulation. Respiratory: Normal respiratory effort.  No retractions. Lungs CTAB and moving good air Gastrointestinal: Soft abdomen not distended quite tender in the right lower quadrant with rebound and guarding Musculoskeletal: No lower extremity edema   Neurologic:  Normal speech and language. No gross focal neurologic deficits are appreciated. Skin:  Skin is warm, dry and intact. No rash noted. Psychiatric: Mood and affect are normal. Speech and behavior are normal.    ____________________________________________   DIFFERENTIAL includes but not limited to  Small bowel obstruction, intra-abdominal abscess, colitis,  pyelonephritis ____________________________________________   LABS (all labs ordered are listed, but only abnormal results are displayed)  Labs Reviewed  COMPREHENSIVE METABOLIC PANEL - Abnormal; Notable for the following components:      Result Value   Potassium 3.3 (*)    Glucose, Bld 105 (*)    Calcium 8.8 (*)    All other components within normal limits  CBC - Abnormal; Notable for the following components:   RDW 15.0 (*)    All other components within normal limits  URINALYSIS, COMPLETE (UACMP) WITH MICROSCOPIC - Abnormal; Notable for the following components:   Color, Urine YELLOW (*)    APPearance HAZY (*)    Squamous Epithelial / LPF 0-5 (*)    All other components within normal limits  LIPASE, BLOOD  POCT PREGNANCY, URINE    Lab work reviewed by me with no acute disease __________________________________________  EKG   ____________________________________________  RADIOLOGY  CT abdomen pelvis reviewed by me consistent with colitis ____________________________________________   PROCEDURES  Procedure(s) performed: no  Procedures  Critical Care performed: no  Observation: no ____________________________________________   INITIAL IMPRESSION / ASSESSMENT AND PLAN / ED COURSE  Pertinent labs & imaging results that were available during my care of the patient were reviewed by me and considered in my medical decision making (see chart for details).  The patient arrives extremely uncomfortable appearing with a quite tender right lower quadrant concerning for postoperative infection versus bleed etc.  IV morphine now for pain and CT scan with IV contrast is pending.      _----------------------------------------- 6:43 PM on 06/30/2017 -----------------------------------------  The patient's pain is adequately controlled and she is able to keep down food and water.  I had a lengthy discussion with the patient regarding the diagnostic uncertainty and the  importance of following up with primary care.  She understands that if her symptoms do not improve after antibiotics she very well may require a colonoscopy.  Strict return precautions have been given and the patient verbalizes understanding and agreement with the plan.  ___________________________________________   FINAL CLINICAL IMPRESSION(S) / ED DIAGNOSES  Final diagnoses:  Colitis      NEW MEDICATIONS STARTED DURING THIS VISIT:  Discharge Medication List as of 06/30/2017  6:42 PM    START taking these medications   Details  ciprofloxacin (CIPRO) 500 MG tablet Take 1 tablet (500 mg total) by mouth 2 (two) times daily for 10 days., Starting Sat 06/30/2017, Until Tue 07/10/2017, Print    metroNIDAZOLE (FLAGYL) 500 MG tablet Take 1 tablet (500 mg total) by mouth 3 (three) times daily for 10 days., Starting Sat 06/30/2017, Until Tue 07/10/2017, Print         Note:  This document was prepared using Dragon voice recognition software and may include unintentional dictation errors.     Merrily Brittle, MD 07/01/17 1051

## 2017-07-03 ENCOUNTER — Encounter: Payer: Self-pay | Admitting: Urology

## 2017-07-03 ENCOUNTER — Ambulatory Visit: Payer: Self-pay | Admitting: Urology

## 2017-07-28 ENCOUNTER — Encounter: Payer: Self-pay | Admitting: Emergency Medicine

## 2017-07-28 ENCOUNTER — Emergency Department
Admission: EM | Admit: 2017-07-28 | Discharge: 2017-07-28 | Discharge status: Absent without leave | Payer: Medicaid Other | Source: Home / Self Care

## 2017-07-28 ENCOUNTER — Emergency Department
Admission: EM | Admit: 2017-07-28 | Discharge: 2017-07-28 | Disposition: A | Discharge status: Home / Self Care | Payer: Medicaid Other | Attending: Emergency Medicine | Admitting: Emergency Medicine

## 2017-07-28 DIAGNOSIS — F329 Major depressive disorder, single episode, unspecified: Secondary | ICD-10-CM | POA: Insufficient documentation

## 2017-07-28 DIAGNOSIS — F32A Depression, unspecified: Secondary | ICD-10-CM

## 2017-07-28 DIAGNOSIS — R45851 Suicidal ideations: Secondary | ICD-10-CM | POA: Insufficient documentation

## 2017-07-28 DIAGNOSIS — K0889 Other specified disorders of teeth and supporting structures: Secondary | ICD-10-CM | POA: Diagnosis not present

## 2017-07-28 LAB — COMPREHENSIVE METABOLIC PANEL
ALBUMIN: 4.7 g/dL (ref 3.5–5.0)
ALT: 18 U/L (ref 14–54)
AST: 19 U/L (ref 15–41)
Alkaline Phosphatase: 46 U/L (ref 38–126)
Anion gap: 5 (ref 5–15)
BILIRUBIN TOTAL: 0.8 mg/dL (ref 0.3–1.2)
BUN: 11 mg/dL (ref 6–20)
CO2: 26 mmol/L (ref 22–32)
Calcium: 9 mg/dL (ref 8.9–10.3)
Chloride: 105 mmol/L (ref 101–111)
Creatinine, Ser: 0.73 mg/dL (ref 0.44–1.00)
Glucose, Bld: 92 mg/dL (ref 65–99)
POTASSIUM: 3.8 mmol/L (ref 3.5–5.1)
Sodium: 136 mmol/L (ref 135–145)
TOTAL PROTEIN: 8.4 g/dL — AB (ref 6.5–8.1)

## 2017-07-28 LAB — CBC
HCT: 39.6 % (ref 35.0–47.0)
Hemoglobin: 13.4 g/dL (ref 12.0–16.0)
MCH: 30.4 pg (ref 26.0–34.0)
MCHC: 33.8 g/dL (ref 32.0–36.0)
MCV: 90 fL (ref 80.0–100.0)
PLATELETS: 236 10*3/uL (ref 150–440)
RBC: 4.4 MIL/uL (ref 3.80–5.20)
RDW: 15 % — ABNORMAL HIGH (ref 11.5–14.5)
WBC: 7.1 10*3/uL (ref 3.6–11.0)

## 2017-07-28 LAB — URINE DRUG SCREEN, QUALITATIVE (ARMC ONLY)
AMPHETAMINES, UR SCREEN: NOT DETECTED
BENZODIAZEPINE, UR SCRN: NOT DETECTED
Barbiturates, Ur Screen: NOT DETECTED
Cannabinoid 50 Ng, Ur ~~LOC~~: POSITIVE — AB
Cocaine Metabolite,Ur ~~LOC~~: NOT DETECTED
MDMA (ECSTASY) UR SCREEN: NOT DETECTED
Methadone Scn, Ur: NOT DETECTED
Opiate, Ur Screen: NOT DETECTED
Phencyclidine (PCP) Ur S: NOT DETECTED
TRICYCLIC, UR SCREEN: NOT DETECTED

## 2017-07-28 LAB — ACETAMINOPHEN LEVEL

## 2017-07-28 LAB — POCT PREGNANCY, URINE: PREG TEST UR: NEGATIVE

## 2017-07-28 LAB — SALICYLATE LEVEL

## 2017-07-28 MED ORDER — CLINDAMYCIN HCL 300 MG PO CAPS: 300.0000 mg | ORAL_CAPSULE | Freq: Three times a day (TID) | ORAL | 0 refills | Status: AC

## 2017-07-28 NOTE — Discharge Instructions (Addendum)
You have been seen in the emergency department for a  psychiatric concern. You have been evaluated both medically as well as psychiatrically. Please follow-up with your outpatient resources provided. Return to the emergency department for any worsening symptoms, or any thoughts of hurting yourself or anyone else so that we may attempt to help you.  Please follow-up with the dentist as soon as possible for evaluation of your teeth and likely extraction.
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
## 2017-07-28 NOTE — ED Notes (Signed)
Pt will be discharged home. Pt given clothing to get dressed. Maintained on 15 minute checks and observation by security camera for safety.
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
## 2017-07-28 NOTE — ED Notes (Signed)
Pt requesting to leave the hospital. EDP notified.

## 2017-07-28 NOTE — ED Triage Notes (Signed)
Pt here with Emergency planning/management officer voluntarily with depression. Pt reports thoughts of self harmbut states "I am not going to hurt myself I know that I am just depression". Pt reports has been battling depression since 2008 when one of her kids passed away.

## 2017-07-28 NOTE — ED Notes (Signed)
Pts belongings put in belonging bag. Pts belongings include: pair of tennis shoes, pair of socks, wedding ring and band, silver chain necklace, tongue ring, grey pants, underwear, one bra, two shirts, one hair bow, one blue bag with phone and clothes inside.

## 2017-07-28 NOTE — ED Notes (Signed)
Pt discharged to lobby. Denies SI. Discharge paperwork and prescription reviewed with patient. VS stable. Belongings returned to patient. Pt discharged to lobby.

## 2017-09-07 ENCOUNTER — Emergency Department
Admission: EM | Admit: 2017-09-07 | Discharge: 2017-09-07 | Disposition: A | Discharge status: Home / Self Care | Payer: Medicaid Other | Attending: Emergency Medicine | Admitting: Emergency Medicine

## 2017-09-07 ENCOUNTER — Encounter: Payer: Self-pay | Admitting: Emergency Medicine

## 2017-09-07 DIAGNOSIS — K529 Noninfective gastroenteritis and colitis, unspecified: Secondary | ICD-10-CM | POA: Insufficient documentation

## 2017-09-07 DIAGNOSIS — L245 Irritant contact dermatitis due to other chemical products: Secondary | ICD-10-CM | POA: Diagnosis not present

## 2017-09-07 DIAGNOSIS — R1030 Lower abdominal pain, unspecified: Secondary | ICD-10-CM | POA: Diagnosis present

## 2017-09-07 LAB — COMPREHENSIVE METABOLIC PANEL
ALBUMIN: 4 g/dL (ref 3.5–5.0)
ALT: 20 U/L (ref 14–54)
ANION GAP: 8 (ref 5–15)
AST: 25 U/L (ref 15–41)
Alkaline Phosphatase: 45 U/L (ref 38–126)
BILIRUBIN TOTAL: 0.6 mg/dL (ref 0.3–1.2)
BUN: 11 mg/dL (ref 6–20)
CHLORIDE: 107 mmol/L (ref 101–111)
CO2: 23 mmol/L (ref 22–32)
Calcium: 8.9 mg/dL (ref 8.9–10.3)
Creatinine, Ser: 0.89 mg/dL (ref 0.44–1.00)
GFR calc Af Amer: >60 mL/min (ref >60)
GFR calc non Af Amer: >60 mL/min (ref >60)
Glucose, Bld: 136 mg/dL — ABNORMAL HIGH (ref 65–99)
POTASSIUM: 3.3 mmol/L — AB (ref 3.5–5.1)
SODIUM: 138 mmol/L (ref 135–145)
Total Protein: 7.7 g/dL (ref 6.5–8.1)

## 2017-09-07 LAB — URINALYSIS, COMPLETE (UACMP) WITH MICROSCOPIC
BILIRUBIN URINE: NEGATIVE
Glucose, UA: NEGATIVE mg/dL
Hgb urine dipstick: NEGATIVE
KETONES UR: NEGATIVE mg/dL
Leukocytes, UA: NEGATIVE
Nitrite: NEGATIVE
Protein, ur: NEGATIVE mg/dL
Specific Gravity, Urine: 1.016 (ref 1.005–1.030)
WBC, UA: NONE SEEN WBC/hpf (ref 0–5)
pH: 6 (ref 5.0–8.0)

## 2017-09-07 LAB — CBC
HEMATOCRIT: 39.6 % (ref 35.0–47.0)
Hemoglobin: 13.4 g/dL (ref 12.0–16.0)
MCH: 30.1 pg (ref 26.0–34.0)
MCHC: 33.8 g/dL (ref 32.0–36.0)
MCV: 89.2 fL (ref 80.0–100.0)
Platelets: 274 10*3/uL (ref 150–440)
RBC: 4.44 MIL/uL (ref 3.80–5.20)
RDW: 15.3 % — ABNORMAL HIGH (ref 11.5–14.5)
WBC: 8.3 10*3/uL (ref 3.6–11.0)

## 2017-09-07 LAB — POCT PREGNANCY, URINE: PREG TEST UR: NEGATIVE

## 2017-09-07 LAB — LIPASE, BLOOD: Lipase: 36 U/L (ref 11–51)

## 2017-09-07 MED ORDER — CIPROFLOXACIN HCL 500 MG PO TABS
500.0000 mg | ORAL_TABLET | Freq: Once | ORAL | Status: AC
  Administered 2017-09-07: 500 mg via ORAL
  Filled 2017-09-07: qty 1

## 2017-09-07 MED ORDER — HYDROCODONE-ACETAMINOPHEN 5-325 MG PO TABS
1.0000 | ORAL_TABLET | Freq: Once | ORAL | Status: AC
  Administered 2017-09-07: 1 via ORAL
  Filled 2017-09-07: qty 1

## 2017-09-07 MED ORDER — DICYCLOMINE HCL 20 MG PO TABS: 20.0000 mg | ORAL_TABLET | Freq: Three times a day (TID) | ORAL | 0 refills | Status: DC | PRN

## 2017-09-07 MED ORDER — TRIAMCINOLONE ACETONIDE 0.1 % EX OINT: 1.0000 "application " | TOPICAL_OINTMENT | Freq: Two times a day (BID) | CUTANEOUS | 1 refills | Status: DC

## 2017-09-07 MED ORDER — CIPROFLOXACIN HCL 500 MG PO TABS: 500.0000 mg | ORAL_TABLET | Freq: Two times a day (BID) | ORAL | 0 refills | Status: DC

## 2017-09-07 MED ORDER — METRONIDAZOLE 500 MG PO TABS
500.0000 mg | ORAL_TABLET | Freq: Once | ORAL | Status: AC
  Administered 2017-09-07: 500 mg via ORAL
  Filled 2017-09-07: qty 1

## 2017-09-07 MED ORDER — HYDROCODONE-ACETAMINOPHEN 5-325 MG PO TABS: 1.0000 | ORAL_TABLET | Freq: Four times a day (QID) | ORAL | 0 refills | Status: DC | PRN

## 2017-09-07 MED ORDER — ONDANSETRON 4 MG PO TBDP
4.0000 mg | ORAL_TABLET | Freq: Once | ORAL | Status: AC
  Administered 2017-09-07: 4 mg via ORAL
  Filled 2017-09-07: qty 1

## 2017-09-07 MED ORDER — DICYCLOMINE HCL 10 MG PO CAPS
10.0000 mg | ORAL_CAPSULE | Freq: Once | ORAL | Status: AC
  Administered 2017-09-07: 10 mg via ORAL
  Filled 2017-09-07: qty 1

## 2017-09-07 MED ORDER — METRONIDAZOLE 500 MG PO TABS: 500.0000 mg | ORAL_TABLET | Freq: Three times a day (TID) | ORAL | 0 refills | Status: AC

## 2017-09-07 NOTE — Discharge Instructions (Signed)
Your exam and labs are consistent with a mild case of colitis. This is similar to the same diagnosis you had in April. Take the antibiotic as directed. Drink plenty of fluids and eat carbohydrate-rich foods. Take the antibiotics as directed. Consider follow-up with Gastroenterology for further management. You are also being treated for a contact dermatitis. Use the steroid as directed. Consider starting Benadryl and Zantac for itch relief. Follow-up with your provider as needed.

## 2017-09-07 NOTE — ED Provider Notes (Signed)
[facility] Emergency Department Provider Note ____________________________________________  Time seen: 1600  I have reviewed the triage vital signs and the nursing notes.  HISTORY  Chief Complaint  Abdominal Pain; Diarrhea; and Rash  HPI Natasja Niday is a 32 y.o. female to the ED for 3 to 4-day complaint of lower abdominal pain with diarrhea.  Patient describes central lower abdominal pain for the last 3 days.  She is also noted some nausea without vomiting.  She denies any sick contacts, recent travel, or bad food exposure.  She is noted watery diarrhea with most recent episode being today.  She would describe a total of about 12 loose, watery bowel movements over the last 4 days.  She denies any bright red blood on toilet tissue, hematochezia, or rectal pain.  Patient unrelated complaint of some irritation to the dorsal forearms bilaterally.  Onset is been for the last 3 weeks after she pulled carpet a bite of a local house that was infested with bedbugs and fleas.  Past Medical History:  Diagnosis Date  . History of blood transfusion   . History of preterm labor     Patient Active Problem List   Diagnosis Date Noted  . Postoperative state 05/02/2017  . Uterine contractions during pregnancy 05/01/2017  . Premature uterine contractions 04/13/2017  . False labor after 37 weeks of gestation without delivery 04/13/2017  . Abdominal pain 03/31/2017  . Insufficient prenatal care 03/07/2016  . 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 Postpartum care following vaginal delivery 03/07/2016    Past Surgical History:  Procedure Laterality Date  . APPENDECTOMY    . BLADDER REPAIR N/A 05/02/2017   Procedure: BLADDER REPAIR;  Surgeon: Schermerhorn, Ihor Austin, MD;  Location: [facility] ORS;  Service: Obstetrics;  Laterality: N/A;  . CESAREAN SECTION N/A 05/02/2017   Procedure: CESAREAN SECTION;  Surgeon: Feliberto Gottron Ihor Austin, MD;  Location: [facility] ORS;  Service: Obstetrics;   Laterality: N/A;  . CHOLECYSTECTOMY    . NEPHRECTOMY Left     Prior to Admission medications   Medication Sig Start Date End Date Taking? Authorizing Provider  ciprofloxacin (CIPRO) 500 MG tablet Take 1 tablet (500 mg total) by mouth 2 (two) times daily. 09/07/17   Briseida Gittings, Charlesetta Ivory, PA-C  dicyclomine (BENTYL) 20 MG tablet Take 1 tablet (20 mg total) by mouth 3 (three) times daily as needed for spasms. 09/07/17 09/07/18  Sokha Craker, Charlesetta Ivory, PA-C  docusate sodium (COLACE) 100 MG capsule Take 1 tablet once or twice daily as needed for constipation while taking narcotic pain medicine Patient not taking: Reported on 07/28/2017 05/14/17   Loleta Rose, MD  HYDROcodone-acetaminophen (NORCO) 5-325 MG tablet Take 1 tablet by mouth every 6 (six) hours as needed. 09/07/17   Burt Piatek, Charlesetta Ivory, PA-C  ibuprofen (ADVIL,MOTRIN) 600 MG tablet Take 1 tablet (600 mg total) by mouth every 6 (six) hours. Patient not taking: Reported on 07/28/2017 05/05/17   Ward, Elenora Fender, MD  metroNIDAZOLE (FLAGYL) 500 MG tablet Take 1 tablet (500 mg total) by mouth 3 (three) times daily for 7 days. 09/07/17 09/14/17  Dary Dilauro, Charlesetta Ivory, PA-C  nicotine (NICODERM CQ - DOSED IN MG/24 HOURS) 14 mg/24hr patch Place 1 patch (14 mg total) onto the skin daily. Patient not taking: Reported on 07/28/2017 05/06/17   Ward, Elenora Fender, MD  oxyCODONE (OXY IR/ROXICODONE) 5 MG immediate release tablet Take 1-2 tablets (5-10 mg total) by mouth every 6 (six) hours as needed for severe pain. Patient not taking: Reported  on 07/28/2017 05/05/17   Ward, Elenora Fenderhelsea C, MD  oxyCODONE-acetaminophen (PERCOCET/ROXICET) 5-325 MG tablet Take 1 tablet by mouth every 4 (four) hours as needed for severe pain. Patient not taking: Reported on 07/28/2017 06/30/17   Merrily Brittleifenbark, Neil, MD  triamcinolone ointment (KENALOG) 0.1 % Apply 1 application topically 2 (two) times daily. 09/07/17   Mikhaila Roh, Charlesetta IvoryJenise V Bacon, PA-C    Allergies Penicillins  Family History   Problem Relation Age of Onset  . Hypertension Maternal Grandmother   . Cancer Paternal Grandmother   . Cancer Paternal Grandfather     Social History Social History   Tobacco Use  . Smoking status: Current Some Day Smoker    Packs/day: 1.00    Years: 15.00    Pack years: 15.00    Types: Cigarettes  . Smokeless tobacco: Never Used  Substance Use Topics  . Alcohol use: No  . Drug use: No    Review of Systems  Constitutional: Negative for fever. Eyes: Negative for visual changes. ENT: Negative for sore throat. Cardiovascular: Negative for chest pain. Respiratory: Negative for shortness of breath. Gastrointestinal: Positive for abdominal pain and diarrhea. Denies constipation, or vomiting Genitourinary: Negative for dysuria. Musculoskeletal: Negative for back pain. Skin: positive for rash. Neurological: Negative for headaches, focal weakness or numbness. ____________________________________________  PHYSICAL EXAM:  VITAL SIGNS: ED Triage Vitals  Enc Vitals Group     BP 09/07/17 1450 112/89     Pulse Rate 09/07/17 1450 90     Resp 09/07/17 1450 16     Temp 09/07/17 1450 98.6 F (37 C)     Temp Source 09/07/17 1450 Oral     SpO2 09/07/17 1450 98 %     Weight 09/07/17 1451 150 lb (68 kg)     Height 09/07/17 1451 5\' 7"  (1.702 m)     Head Circumference --      Peak Flow --      Pain Score 09/07/17 1451 8     Pain Loc --      Pain Edu? --      Excl. in GC? --     Constitutional: Alert and oriented. Well appearing and in no distress. Head: Normocephalic and atraumatic. Eyes: Conjunctivae are normal. Normal extraocular movements Cardiovascular: Normal rate, regular rhythm. Normal distal pulses. Respiratory: Normal respiratory effort. No wheezes/rales/rhonchi. Gastrointestinal: Soft and nontender. No distention or rebound, guarding, or rigidity.  Organomegaly appreciated.  Normal bowel sounds x4.  No CVA tenderness noted. Musculoskeletal: Nontender with normal  range of motion in all extremities.  Neurologic:  Normal gait without ataxia. Normal speech and language. No gross focal neurologic deficits are appreciated. Skin:  Skin is warm, dry and intact.  Patient with erythematous maculopapular lesions to the dorsal forearms.  Lesions show excoriation and dried scab in some areas. Psychiatric: Mood and affect are normal. Patient exhibits appropriate insight and judgment. ____________________________________________   LABS (pertinent positives/negatives)  Labs Reviewed  COMPREHENSIVE METABOLIC PANEL - Abnormal; Notable for the following components:      Result Value   Potassium 3.3 (*)    Glucose, Bld 136 (*)    All other components within normal limits  CBC - Abnormal; Notable for the following components:   RDW 15.3 (*)    All other components within normal limits  URINALYSIS, COMPLETE (UACMP) WITH MICROSCOPIC - Abnormal; Notable for the following components:   Color, Urine YELLOW (*)    APPearance CLEAR (*)    Bacteria, UA RARE (*)    All other components  within normal limits  LIPASE, BLOOD  POC URINE PREG, ED  POCT PREGNANCY, URINE  ____________________________________________  PROCEDURES  Procedures Bentyl 10 mg PO Norco 5-325 mg PO Zofran ODT 4 mg PO Metronidazole 500 mg PO Cipro 500 mg PO ____________________________________________  INITIAL IMPRESSION / ASSESSMENT AND PLAN / ED COURSE   DDX: Differential diagnosis includes, but is not limited to, ovarian cyst, ovarian torsion, acute appendicitis, diverticulitis, urinary tract infection/pyelonephritis, endometriosis, bowel obstruction, colitis, renal colic, gastroenteritis, hernia, fibroids, endometriosis, pregnancy related pain including ectopic pregnancy, etc.   Patient with ED evaluation of 3 to 4-day complaint of diarrhea no abdominal pain.  Patient symptoms appear to be consistent with colitis.  She had a similar diagnosis about 2 months prior confirmed with a contrasted  CT study.  We discussed the fact the symptoms at this time seem mild compared to his previous presentation.  Patient also stable vital signs and has still been able to tolerate some food and drink.  Her labs also reassuring at this time.  We discussed that his symptoms likely respond to a similar course of antibiotics and Bentyl at this time.  We did also discuss with the patient symptoms may represent an IBS type syndrome.  Patient agrees to follow-up with GI for further evaluation and management.  She will be discharged at this time with a prescription for Bentyl, metronidazole, and Cipro.  Her rash appears to be consistent with an irritant contact dermatitis.  She will be given a prescription for triamcinolone ointment to dose as directed.  A small prescription for hydrocodone was also provided for pain relief.  Patient verbalizes understanding and is discharged to follow with primary provider and specialist as discussed.  I reviewed the patient's prescription history over the last 12 months in the multi-state controlled substances database(s) that includes [place], [place], [place], [place], [place], [place], [place], [place], [place], [place], [place], [place], [place], and [place].  Results were notable for recent prescription.  ____________________________________________  FINAL CLINICAL IMPRESSION(S) / ED DIAGNOSES  Final diagnoses:  Colitis  Irritant contact dermatitis due to other chemical products      Karmen Stabs, Charlesetta Ivory, PA-C 09/07/17 2333    Jeanmarie Plant, MD 09/07/17 2348

## 2017-09-07 NOTE — ED Notes (Signed)
Pt reports decreased appetite. No vomiting but decreased PO intake and intermittent nausea.

## 2017-09-07 NOTE — ED Notes (Signed)
First Nurse Note: Pt in NAD at this time, c/o abd pain and rash on both arms

## 2017-09-07 NOTE — ED Triage Notes (Signed)
Patient presents to the ED with lower abdominal pain x 3-4 days with diarrhea.  Patient states pain centers around her c-section incision site. Patient had a c-section in February.  Patient has a bright red rash bilaterally to bath arms and hands.  Patient states she has had rash x 3 weeks after helping her friend pull up carpet.  Patient denies rash to any other area.

## 2017-10-06 ENCOUNTER — Emergency Department: Payer: Medicaid Other

## 2017-10-06 ENCOUNTER — Other Ambulatory Visit: Payer: Self-pay

## 2017-10-06 ENCOUNTER — Encounter: Payer: Self-pay | Admitting: Emergency Medicine

## 2017-10-06 ENCOUNTER — Emergency Department
Admission: EM | Admit: 2017-10-06 | Discharge: 2017-10-06 | Disposition: A | Discharge status: Home / Self Care | Payer: Medicaid Other | Attending: Student in an Organized Health Care Education/Training Program | Admitting: Student in an Organized Health Care Education/Training Program

## 2017-10-06 DIAGNOSIS — F17210 Nicotine dependence, cigarettes, uncomplicated: Secondary | ICD-10-CM | POA: Diagnosis not present

## 2017-10-06 DIAGNOSIS — R197 Diarrhea, unspecified: Secondary | ICD-10-CM | POA: Diagnosis not present

## 2017-10-06 DIAGNOSIS — R1013 Epigastric pain: Secondary | ICD-10-CM | POA: Diagnosis present

## 2017-10-06 DIAGNOSIS — Z79899 Other long term (current) drug therapy: Secondary | ICD-10-CM | POA: Insufficient documentation

## 2017-10-06 DIAGNOSIS — R112 Nausea with vomiting, unspecified: Secondary | ICD-10-CM | POA: Diagnosis not present

## 2017-10-06 DIAGNOSIS — R1084 Generalized abdominal pain: Secondary | ICD-10-CM

## 2017-10-06 LAB — URINALYSIS, COMPLETE (UACMP) WITH MICROSCOPIC
BILIRUBIN URINE: NEGATIVE
GLUCOSE, UA: NEGATIVE mg/dL
Hgb urine dipstick: NEGATIVE
KETONES UR: NEGATIVE mg/dL
Leukocytes, UA: NEGATIVE
Nitrite: NEGATIVE
PROTEIN: NEGATIVE mg/dL
Specific Gravity, Urine: 1.014 (ref 1.005–1.030)
pH: 6 (ref 5.0–8.0)

## 2017-10-06 LAB — COMPREHENSIVE METABOLIC PANEL
ALT: 23 U/L (ref 0–44)
ANION GAP: 8 (ref 5–15)
AST: 23 U/L (ref 15–41)
Albumin: 4.4 g/dL (ref 3.5–5.0)
Alkaline Phosphatase: 39 U/L (ref 38–126)
BUN: 10 mg/dL (ref 6–20)
CHLORIDE: 109 mmol/L (ref 98–111)
CO2: 25 mmol/L (ref 22–32)
Calcium: 9.1 mg/dL (ref 8.9–10.3)
Creatinine, Ser: 0.85 mg/dL (ref 0.44–1.00)
GFR calc non Af Amer: >60 mL/min (ref >60)
Glucose, Bld: 103 mg/dL — ABNORMAL HIGH (ref 70–99)
Potassium: 3.8 mmol/L (ref 3.5–5.1)
SODIUM: 142 mmol/L (ref 135–145)
Total Bilirubin: 0.8 mg/dL (ref 0.3–1.2)
Total Protein: 8 g/dL (ref 6.5–8.1)

## 2017-10-06 LAB — WET PREP, GENITAL
Clue Cells Wet Prep HPF POC: NONE SEEN
SPERM: NONE SEEN
Trich, Wet Prep: NONE SEEN
Yeast Wet Prep HPF POC: NONE SEEN

## 2017-10-06 LAB — CHLAMYDIA/NGC RT PCR (ARMC ONLY)
Chlamydia Tr: NOT DETECTED
N gonorrhoeae: NOT DETECTED

## 2017-10-06 LAB — URINE DRUG SCREEN, QUALITATIVE (ARMC ONLY)
Amphetamines, Ur Screen: NOT DETECTED
Benzodiazepine, Ur Scrn: NOT DETECTED
Cannabinoid 50 Ng, Ur ~~LOC~~: POSITIVE — AB
Cocaine Metabolite,Ur ~~LOC~~: NOT DETECTED
MDMA (ECSTASY) UR SCREEN: NOT DETECTED
Methadone Scn, Ur: NOT DETECTED
Opiate, Ur Screen: NOT DETECTED
Phencyclidine (PCP) Ur S: NOT DETECTED
TRICYCLIC, UR SCREEN: NOT DETECTED

## 2017-10-06 LAB — CBC
HCT: 39.2 % (ref 35.0–47.0)
HEMOGLOBIN: 13.5 g/dL (ref 12.0–16.0)
MCH: 31.4 pg (ref 26.0–34.0)
MCHC: 34.4 g/dL (ref 32.0–36.0)
MCV: 91.4 fL (ref 80.0–100.0)
Platelets: 274 10*3/uL (ref 150–440)
RBC: 4.29 MIL/uL (ref 3.80–5.20)
RDW: 15.1 % — ABNORMAL HIGH (ref 11.5–14.5)
WBC: 7.6 10*3/uL (ref 3.6–11.0)

## 2017-10-06 LAB — POCT PREGNANCY, URINE: Preg Test, Ur: NEGATIVE

## 2017-10-06 LAB — LIPASE, BLOOD: LIPASE: 50 U/L (ref 11–51)

## 2017-10-06 MED ORDER — PROMETHAZINE HCL 12.5 MG PO TABS: 12.5000 mg | ORAL_TABLET | Freq: Four times a day (QID) | ORAL | 0 refills | Status: DC | PRN

## 2017-10-06 MED ORDER — MORPHINE SULFATE (PF) 4 MG/ML IV SOLN
4.0000 mg | INTRAVENOUS | Status: DC | PRN
Start: 2017-10-06 — End: 2017-10-07
  Administered 2017-10-06 (×2): 4 mg via INTRAVENOUS
  Filled 2017-10-06 (×2): qty 1

## 2017-10-06 MED ORDER — TRAMADOL HCL 50 MG PO TABS: 50.0000 mg | ORAL_TABLET | Freq: Four times a day (QID) | ORAL | 0 refills | Status: DC | PRN

## 2017-10-06 MED ORDER — SODIUM CHLORIDE 0.9 % IV BOLUS
1000.0000 mL | Freq: Once | INTRAVENOUS | Status: AC
  Administered 2017-10-06: 1000 mL via INTRAVENOUS

## 2017-10-06 MED ORDER — KETOROLAC TROMETHAMINE 30 MG/ML IJ SOLN
15.0000 mg | Freq: Once | INTRAMUSCULAR | Status: AC
  Administered 2017-10-06: 15 mg via INTRAVENOUS
  Filled 2017-10-06: qty 1

## 2017-10-06 MED ORDER — IOPAMIDOL (ISOVUE-300) INJECTION 61%
100.0000 mL | Freq: Once | INTRAVENOUS | Status: AC | PRN
  Administered 2017-10-06: 100 mL via INTRAVENOUS

## 2017-10-06 MED ORDER — PROMETHAZINE HCL 25 MG/ML IJ SOLN
12.5000 mg | Freq: Four times a day (QID) | INTRAMUSCULAR | Status: DC | PRN
  Administered 2017-10-06 (×2): 12.5 mg via INTRAVENOUS
  Filled 2017-10-06 (×2): qty 1

## 2017-10-06 NOTE — ED Notes (Signed)
Pt performed self swab to collect chlamydia and wet prep per MD orders.

## 2017-10-06 NOTE — ED Notes (Signed)
Checked on pt - awoke her from sleeping. States she has had no urge to go to the bathroom yet. Hats are in the toilet if needed.

## 2017-10-06 NOTE — ED Notes (Signed)
Pt hooked back up to the bp cuff. States she was unable to give sample/

## 2017-10-06 NOTE — ED Provider Notes (Signed)
[facility] Emergency Department Provider Note    First MD Initiated Contact with Patient 10/06/17 1558     (approximate)  I have reviewed the triage vital signs and the nursing notes.   HISTORY  Chief Complaint Abdominal Pain    HPI Dionne MiloKrystle Campanelli is a 32 y.o. female presents the ER chief complaint of crampy epigastric pain as well as watery diarrhea for the past several days.  States she is having difficulty keeping food down.  Has had several episodes with this in the past.  Previously diagnosed with colitis and started on antibiotics.  Symptoms did improve after several days.  Does have a family history of Crohn's.  She is given referral to GI but has not followed up with them.  Denies any blood in her stool.  No hematemesis.  No fevers.  Denies any pelvic pain or lower abdominal pain at this time.    Past Medical History:  Diagnosis Date  . History of blood transfusion   . History of preterm labor    Family History  Problem Relation Age of Onset  . Hypertension Maternal Grandmother   . Cancer Paternal Grandmother   . Cancer Paternal Grandfather    Past Surgical History:  Procedure Laterality Date  . APPENDECTOMY    . BLADDER REPAIR N/A 05/02/2017   Procedure: BLADDER REPAIR;  Surgeon: Schermerhorn, Ihor Austinhomas J, MD;  Location: [facility] ORS;  Service: Obstetrics;  Laterality: N/A;  . CESAREAN SECTION N/A 05/02/2017   Procedure: CESAREAN SECTION;  Surgeon: Feliberto GottronSchermerhorn, Ihor Austinhomas J, MD;  Location: [facility] ORS;  Service: Obstetrics;  Laterality: N/A;  . CHOLECYSTECTOMY    . NEPHRECTOMY Left    Patient Active Problem List   Diagnosis Date Noted  . Postoperative state 05/02/2017  . Uterine contractions during pregnancy 05/01/2017  . Premature uterine contractions 04/13/2017  . False labor after 37 weeks of gestation without delivery 04/13/2017  . Abdominal pain 03/31/2017  . Insufficient prenatal care 03/07/2016  . 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 Postpartum  care following vaginal delivery 03/07/2016      Prior to Admission medications   Medication Sig Start Date End Date Taking? Authorizing Provider  ciprofloxacin (CIPRO) 500 MG tablet Take 1 tablet (500 mg total) by mouth 2 (two) times daily. 09/07/17   Menshew, Charlesetta IvoryJenise V Bacon, PA-C  dicyclomine (BENTYL) 20 MG tablet Take 1 tablet (20 mg total) by mouth 3 (three) times daily as needed for spasms. 09/07/17 09/07/18  Menshew, Charlesetta IvoryJenise V Bacon, PA-C  docusate sodium (COLACE) 100 MG capsule Take 1 tablet once or twice daily as needed for constipation while taking narcotic pain medicine Patient not taking: Reported on 07/28/2017 05/14/17   Loleta RoseForbach, Cory, MD  HYDROcodone-acetaminophen (NORCO) 5-325 MG tablet Take 1 tablet by mouth every 6 (six) hours as needed. 09/07/17   Menshew, Charlesetta IvoryJenise V Bacon, PA-C  ibuprofen (ADVIL,MOTRIN) 600 MG tablet Take 1 tablet (600 mg total) by mouth every 6 (six) hours. Patient not taking: Reported on 07/28/2017 05/05/17   Ward, Elenora Fenderhelsea C, MD  nicotine (NICODERM CQ - DOSED IN MG/24 HOURS) 14 mg/24hr patch Place 1 patch (14 mg total) onto the skin daily. Patient not taking: Reported on 07/28/2017 05/06/17   Ward, Elenora Fenderhelsea C, MD  oxyCODONE (OXY IR/ROXICODONE) 5 MG immediate release tablet Take 1-2 tablets (5-10 mg total) by mouth every 6 (six) hours as needed for severe pain. Patient not taking: Reported on 07/28/2017 05/05/17   Ward, Elenora Fenderhelsea C, MD  oxyCODONE-acetaminophen (PERCOCET/ROXICET) 5-325 MG tablet Take  1 tablet by mouth every 4 (four) hours as needed for severe pain. Patient not taking: Reported on 07/28/2017 06/30/17   Merrily Brittle, MD  promethazine (PHENERGAN) 12.5 MG tablet Take 1 tablet (12.5 mg total) by mouth every 6 (six) hours as needed for nausea or vomiting. 10/06/17   Willy Eddy, MD  traMADol (ULTRAM) 50 MG tablet Take 1 tablet (50 mg total) by mouth every 6 (six) hours as needed. 10/06/17 10/06/18  Willy Eddy, MD  triamcinolone ointment (KENALOG) 0.1 %  Apply 1 application topically 2 (two) times daily. 09/07/17   Menshew, Charlesetta Ivory, PA-C    Allergies Penicillins    Social History Social History   Tobacco Use  . Smoking status: Current Some Day Smoker    Packs/day: 1.00    Years: 15.00    Pack years: 15.00    Types: Cigarettes  . Smokeless tobacco: Never Used  Substance Use Topics  . Alcohol use: No  . Drug use: No    Review of Systems Patient denies headaches, rhinorrhea, blurry vision, numbness, shortness of breath, chest pain, edema, cough, abdominal pain, nausea, vomiting, diarrhea, dysuria, fevers, rashes or hallucinations unless otherwise stated above in HPI. ____________________________________________   PHYSICAL EXAM:  VITAL SIGNS: Vitals:   10/06/17 1930 10/06/17 2000  BP: 101/60 110/60  Pulse:  (!) 49  Resp:    Temp:    SpO2:  99%    Constitutional: Alert and oriented.  Eyes: Conjunctivae are normal.  Head: Atraumatic. Nose: No congestion/rhinnorhea. Mouth/Throat: Mucous membranes are moist.   Neck: No stridor. Painless ROM.  Cardiovascular: Normal rate, regular rhythm. Grossly normal heart sounds.  Good peripheral circulation. Respiratory: Normal respiratory effort.  No retractions. Lungs CTAB. Gastrointestinal: Soft and nontender. No distention. No abdominal bruits. No CVA tenderness. Genitourinary: deferred Musculoskeletal: No lower extremity tenderness nor edema.  No joint effusions. Neurologic:  Normal speech and language. No gross focal neurologic deficits are appreciated. No facial droop Skin:  Skin is warm, dry and intact. No rash noted. Psychiatric: Mood and affect are normal. Speech and behavior are normal.  ____________________________________________   LABS (all labs ordered are listed, but only abnormal results are displayed)  Results for orders placed or performed during the hospital encounter of 10/06/17 (from the past 24 hour(s))  Lipase, blood     Status: None   Collection  Time: 10/06/17  2:21 PM  Result Value Ref Range   Lipase 50 11 - 51 U/L  Comprehensive metabolic panel     Status: Abnormal   Collection Time: 10/06/17  2:21 PM  Result Value Ref Range   Sodium 142 135 - 145 mmol/L   Potassium 3.8 3.5 - 5.1 mmol/L   Chloride 109 98 - 111 mmol/L   CO2 25 22 - 32 mmol/L   Glucose, Bld 103 (H) 70 - 99 mg/dL   BUN 10 6 - 20 mg/dL   Creatinine, Ser 1.61 0.44 - 1.00 mg/dL   Calcium 9.1 8.9 - 09.6 mg/dL   Total Protein 8.0 6.5 - 8.1 g/dL   Albumin 4.4 3.5 - 5.0 g/dL   AST 23 15 - 41 U/L   ALT 23 0 - 44 U/L   Alkaline Phosphatase 39 38 - 126 U/L   Total Bilirubin 0.8 0.3 - 1.2 mg/dL   GFR calc non Af Amer >60 >60 mL/min   GFR calc Af Amer >60 >60 mL/min   Anion gap 8 5 - 15  CBC     Status: Abnormal  Collection Time: 10/06/17  2:21 PM  Result Value Ref Range   WBC 7.6 3.6 - 11.0 K/uL   RBC 4.29 3.80 - 5.20 MIL/uL   Hemoglobin 13.5 12.0 - 16.0 g/dL   HCT 16.1 09.6 - 04.5 %   MCV 91.4 80.0 - 100.0 fL   MCH 31.4 26.0 - 34.0 pg   MCHC 34.4 32.0 - 36.0 g/dL   RDW 40.9 (H) 81.1 - 91.4 %   Platelets 274 150 - 440 K/uL  Urinalysis, Complete w Microscopic     Status: Abnormal   Collection Time: 10/06/17  2:21 PM  Result Value Ref Range   Color, Urine YELLOW (A) YELLOW   APPearance HAZY (A) CLEAR   Specific Gravity, Urine 1.014 1.005 - 1.030   pH 6.0 5.0 - 8.0   Glucose, UA NEGATIVE NEGATIVE mg/dL   Hgb urine dipstick NEGATIVE NEGATIVE   Bilirubin Urine NEGATIVE NEGATIVE   Ketones, ur NEGATIVE NEGATIVE mg/dL   Protein, ur NEGATIVE NEGATIVE mg/dL   Nitrite NEGATIVE NEGATIVE   Leukocytes, UA NEGATIVE NEGATIVE   RBC / HPF 0-5 0 - 5 RBC/hpf   WBC, UA 0-5 0 - 5 WBC/hpf   Bacteria, UA RARE (A) NONE SEEN   Squamous Epithelial / LPF 0-5 0 - 5   Mucus PRESENT   Urine Drug Screen, Qualitative ([facility] only)     Status: Abnormal   Collection Time: 10/06/17  2:21 PM  Result Value Ref Range   Tricyclic, Ur Screen NONE DETECTED NONE DETECTED   Amphetamines,  Ur Screen NONE DETECTED NONE DETECTED   MDMA (Ecstasy)Ur Screen NONE DETECTED NONE DETECTED   Cocaine Metabolite,Ur [place] NONE DETECTED NONE DETECTED   Opiate, Ur Screen NONE DETECTED NONE DETECTED   Phencyclidine (PCP) Ur S NONE DETECTED NONE DETECTED   Cannabinoid 50 Ng, Ur [place] POSITIVE (A) NONE DETECTED   Barbiturates, Ur Screen (A) NONE DETECTED    Result not available. Reagent lot number recalled by manufacturer.   Benzodiazepine, Ur Scrn NONE DETECTED NONE DETECTED   Methadone Scn, Ur NONE DETECTED NONE DETECTED  Pregnancy, urine POC     Status: None   Collection Time: 10/06/17  2:31 PM  Result Value Ref Range   Preg Test, Ur NEGATIVE NEGATIVE  Chlamydia/NGC rt PCR ([facility] only)     Status: None   Collection Time: 10/06/17  6:07 PM  Result Value Ref Range   Specimen source GC/Chlam ENDOCERVICAL    Chlamydia Tr NOT DETECTED NOT DETECTED   N gonorrhoeae NOT DETECTED NOT DETECTED  Wet prep, genital     Status: Abnormal   Collection Time: 10/06/17  6:07 PM  Result Value Ref Range   Yeast Wet Prep HPF POC NONE SEEN NONE SEEN   Trich, Wet Prep NONE SEEN NONE SEEN   Clue Cells Wet Prep HPF POC NONE SEEN NONE SEEN   WBC, Wet Prep HPF POC MODERATE (A) NONE SEEN   Sperm NONE SEEN    ____________________________________________ ____________________________________________  RADIOLOGY  I personally reviewed all radiographic images ordered to evaluate for the above acute complaints and reviewed radiology reports and findings.  These findings were personally discussed with the patient.  Please see medical record for radiology report.  ____________________________________________   PROCEDURES  Procedure(s) performed:  Procedures    Critical Care performed: no ____________________________________________   INITIAL IMPRESSION / ASSESSMENT AND PLAN / ED COURSE  Pertinent labs & imaging results that were available during my care of the patient were reviewed by me and considered in  my  medical decision making (see chart for details).   DDX: enteritis, hyperemesis canabinoid syndrome, colitis, uti, enteritis  Weslyn Holsonback is a 32 y.o. who presents to the ED with symptoms as described above.  She is afebrile.  Based on initial exam and I believe that CT imaging clinically indicated.  Blood work is reassuring.  No signs or symptoms to suggest PID.  Discussed option for pelvic exam of which patient deferred and preferred for self swab.  Does not seem clinically consistent with torsion.  UDS does show positive for marijuana therefore, suspect a component of hyperemesis related to that.  Will provide IV fluids as well as IV pain medication.  Clinical Course as of Oct 06 2048  Sat Oct 06, 2017  1927 Patient complaining of persistent pain.  States this is worse than previous time she had CT imaging.  After discussion of risks and benefits of additional CT imaging will proceed with reimaging today to exclude and evaluate for IVDA and above differential.   [PR]  2047 Does not show any evidence of acute abnormality.  Do feel patient requires follow-up with GI due to concern for IBD.  Remains [place] dynamically stable.  At this point do believe she stable and appropriate for outpatient follow-up.   [PR]    Clinical Course User Index [PR] Willy Eddy, MD     As part of my medical decision making, I reviewed the following data within the electronic MEDICAL RECORD NUMBER Nursing notes reviewed and incorporated, Labs reviewed, notes from prior ED visits.   ____________________________________________   FINAL CLINICAL IMPRESSION(S) / ED DIAGNOSES  Final diagnoses:  Nausea vomiting and diarrhea  Generalized abdominal pain      NEW MEDICATIONS STARTED DURING THIS VISIT:  New Prescriptions   PROMETHAZINE (PHENERGAN) 12.5 MG TABLET    Take 1 tablet (12.5 mg total) by mouth every 6 (six) hours as needed for nausea or vomiting.   TRAMADOL (ULTRAM) 50 MG TABLET    Take 1 tablet  (50 mg total) by mouth every 6 (six) hours as needed.     Note:  This document was prepared using Dragon voice recognition software and may include unintentional dictation errors.    Willy Eddy, MD 10/06/17 2050

## 2017-10-06 NOTE — Discharge Instructions (Signed)

## 2017-10-06 NOTE — ED Notes (Signed)
Pt in ct 

## 2017-10-06 NOTE — ED Triage Notes (Signed)
Lower abd pain x 3 days, nausea, vomiting and diarrhea.

## 2017-10-26 ENCOUNTER — Emergency Department
Admission: EM | Admit: 2017-10-26 | Discharge: 2017-10-26 | Disposition: A | Discharge status: Home / Self Care | Payer: Medicaid Other | Attending: Emergency Medicine | Admitting: Emergency Medicine

## 2017-10-26 ENCOUNTER — Encounter: Payer: Self-pay | Admitting: Emergency Medicine

## 2017-10-26 DIAGNOSIS — F17210 Nicotine dependence, cigarettes, uncomplicated: Secondary | ICD-10-CM | POA: Diagnosis not present

## 2017-10-26 DIAGNOSIS — K0889 Other specified disorders of teeth and supporting structures: Secondary | ICD-10-CM | POA: Diagnosis not present

## 2017-10-26 MED ORDER — TRAMADOL HCL 50 MG PO TABS: 50.0000 mg | ORAL_TABLET | Freq: Two times a day (BID) | ORAL | 0 refills | Status: DC | PRN

## 2017-10-26 MED ORDER — LIDOCAINE VISCOUS HCL 2 % MT SOLN: 5.0000 mL | Freq: Four times a day (QID) | OROMUCOSAL | 0 refills | Status: DC | PRN

## 2017-10-26 MED ORDER — NAPROXEN 500 MG PO TABS: 500.0000 mg | ORAL_TABLET | Freq: Two times a day (BID) | ORAL | Status: DC

## 2017-10-26 NOTE — ED Provider Notes (Signed)
[facility] Emergency Department Provider Note   ____________________________________________   First MD Initiated Contact with Patient 10/26/17 1036     (approximate)  I have reviewed the triage vital signs and the nursing notes.   HISTORY  Chief Complaint No chief complaint on file.    HPI Kim Kidd is a 32 y.o. female patient complained of dental pain secondary to fractured tooth.  Patient states she called her dentist but cannot get appointment until 2 weeks will be unable to see him in the next 2 weeks.  Patient describes the pain is "achy".  No palliative measure for complaint.   Past Medical History:  Diagnosis Date  . History of blood transfusion   . History of preterm labor     Patient Active Problem List   Diagnosis Date Noted  . Postoperative state 05/02/2017  . Uterine contractions during pregnancy 05/01/2017  . Premature uterine contractions 04/13/2017  . False labor after 37 weeks of gestation without delivery 04/13/2017  . Abdominal pain 03/31/2017  . Insufficient prenatal care 03/07/2016  . 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 Postpartum care following vaginal delivery 03/07/2016    Past Surgical History:  Procedure Laterality Date  . APPENDECTOMY    . BLADDER REPAIR N/A 05/02/2017   Procedure: BLADDER REPAIR;  Surgeon: Schermerhorn, Ihor Austin, MD;  Location: [facility] ORS;  Service: Obstetrics;  Laterality: N/A;  . CESAREAN SECTION N/A 05/02/2017   Procedure: CESAREAN SECTION;  Surgeon: Feliberto Gottron Ihor Austin, MD;  Location: [facility] ORS;  Service: Obstetrics;  Laterality: N/A;  . CHOLECYSTECTOMY    . NEPHRECTOMY Left     Prior to Admission medications   Medication Sig Start Date End Date Taking? Authorizing Provider  ciprofloxacin (CIPRO) 500 MG tablet Take 1 tablet (500 mg total) by mouth 2 (two) times daily. 09/07/17   Menshew, Charlesetta Ivory, PA-C  dicyclomine (BENTYL) 20 MG tablet Take 1 tablet (20 mg total) by mouth 3 (three)  times daily as needed for spasms. 09/07/17 09/07/18  Menshew, Charlesetta Ivory, PA-C  docusate sodium (COLACE) 100 MG capsule Take 1 tablet once or twice daily as needed for constipation while taking narcotic pain medicine Patient not taking: Reported on 07/28/2017 05/14/17   Loleta Rose, MD  HYDROcodone-acetaminophen (NORCO) 5-325 MG tablet Take 1 tablet by mouth every 6 (six) hours as needed. 09/07/17   Menshew, Charlesetta Ivory, PA-C  ibuprofen (ADVIL,MOTRIN) 600 MG tablet Take 1 tablet (600 mg total) by mouth every 6 (six) hours. Patient not taking: Reported on 07/28/2017 05/05/17   Ward, Elenora Fender, MD  lidocaine (XYLOCAINE) 2 % solution Use as directed 5 mLs in the mouth or throat every 6 (six) hours as needed for mouth pain. 10/26/17   Joni Reining, PA-C  naproxen (NAPROSYN) 500 MG tablet Take 1 tablet (500 mg total) by mouth 2 (two) times daily with a meal. 10/26/17   Joni Reining, PA-C  nicotine (NICODERM CQ - DOSED IN MG/24 HOURS) 14 mg/24hr patch Place 1 patch (14 mg total) onto the skin daily. Patient not taking: Reported on 07/28/2017 05/06/17   Ward, Elenora Fender, MD  oxyCODONE (OXY IR/ROXICODONE) 5 MG immediate release tablet Take 1-2 tablets (5-10 mg total) by mouth every 6 (six) hours as needed for severe pain. Patient not taking: Reported on 07/28/2017 05/05/17   Ward, Elenora Fender, MD  oxyCODONE-acetaminophen (PERCOCET/ROXICET) 5-325 MG tablet Take 1 tablet by mouth every 4 (four) hours as needed for severe pain. Patient not taking: Reported on  07/28/2017 06/30/17   Merrily Brittleifenbark, Neil, MD  promethazine (PHENERGAN) 12.5 MG tablet Take 1 tablet (12.5 mg total) by mouth every 6 (six) hours as needed for nausea or vomiting. 10/06/17   Willy Eddyobinson, Patrick, MD  traMADol (ULTRAM) 50 MG tablet Take 1 tablet (50 mg total) by mouth every 6 (six) hours as needed. 10/06/17 10/06/18  Willy Eddyobinson, Patrick, MD  traMADol (ULTRAM) 50 MG tablet Take 1 tablet (50 mg total) by mouth every 12 (twelve) hours as needed. 10/26/17    Joni ReiningSmith, Marquarius Lofton K, PA-C  triamcinolone ointment (KENALOG) 0.1 % Apply 1 application topically 2 (two) times daily. 09/07/17   Menshew, Charlesetta IvoryJenise V Bacon, PA-C    Allergies Penicillins  Family History  Problem Relation Age of Onset  . Hypertension Maternal Grandmother   . Cancer Paternal Grandmother   . Cancer Paternal Grandfather     Social History Social History   Tobacco Use  . Smoking status: Current Some Day Smoker    Packs/day: 1.00    Years: 15.00    Pack years: 15.00    Types: Cigarettes  . Smokeless tobacco: Never Used  Substance Use Topics  . Alcohol use: No  . Drug use: No    Review of Systems  Constitutional: No fever/chills Eyes: No visual changes. ENT: No sore throat.  Dental pain. Cardiovascular: Denies chest pain. Respiratory: Denies shortness of breath. Gastrointestinal: No abdominal pain.  No nausea, no vomiting.  No diarrhea.  No constipation. Genitourinary: Negative for dysuria. Musculoskeletal: Negative for back pain. Skin: Negative for rash. Neurological: Negative for headaches, focal weakness or numbness.   ____________________________________________   PHYSICAL EXAM:  VITAL SIGNS: ED Triage Vitals  Enc Vitals Group     BP 10/26/17 1032 114/70     Pulse Rate 10/26/17 1032 83     Resp 10/26/17 1032 18     Temp 10/26/17 1032 98.8 F (37.1 C)     Temp Source 10/26/17 1032 Oral     SpO2 10/26/17 1032 100 %     Weight --      Height --      Head Circumference --      Peak Flow --      Pain Score 10/26/17 1043 0     Pain Loc --      Pain Edu? --      Excl. in GC? --    Constitutional: Alert and oriented. Well appearing and in no acute distress. Mouth/Throat: Mucous membranes are moist.  Oropharynx non-erythematous.  Fractured tooth #13. Neck: No stridor.  Cardiovascular: Normal rate, regular rhythm. Grossly normal heart sounds.  Good peripheral circulation. Respiratory: Normal respiratory effort.  No retractions. Lungs  CTAB.  ____________________________________________   LABS (all labs ordered are listed, but only abnormal results are displayed)  Labs Reviewed - No data to display ____________________________________________  EKG   ____________________________________________  RADIOLOGY  ED MD interpretation:    Official radiology report(s): No results found.  ____________________________________________   PROCEDURES  Procedure(s) performed: None  Procedures  Critical Care performed: No  ____________________________________________   INITIAL IMPRESSION / ASSESSMENT AND PLAN / ED COURSE  As part of my medical decision making, I reviewed the following data within the electronic MEDICAL RECORD NUMBER    Dental pain secondary to fractured tooth.  Patient given discharge care instruction advised to follow-up with her treating dentist.  Take medication as directed.      ____________________________________________   FINAL CLINICAL IMPRESSION(S) / ED DIAGNOSES  Final diagnoses:  Pain, dental  ED Discharge Orders         Ordered    lidocaine (XYLOCAINE) 2 % solution  Every 6 hours PRN     10/26/17 1101    naproxen (NAPROSYN) 500 MG tablet  2 times daily with meals     10/26/17 1101    traMADol (ULTRAM) 50 MG tablet  Every 12 hours PRN     10/26/17 1101           Note:  This document was prepared using Dragon voice recognition software and may include unintentional dictation errors.    Joni Reining, PA-C 10/26/17 1109    Minna Antis, MD 10/27/17 2025

## 2017-10-26 NOTE — ED Notes (Signed)

## 2017-11-26 ENCOUNTER — Emergency Department
Admission: EM | Admit: 2017-11-26 | Discharge: 2017-11-26 | Disposition: A | Discharge status: Home / Self Care | Payer: Medicaid Other | Attending: Emergency Medicine | Admitting: Emergency Medicine

## 2017-11-26 ENCOUNTER — Encounter: Payer: Self-pay | Admitting: Emergency Medicine

## 2017-11-26 ENCOUNTER — Other Ambulatory Visit: Payer: Self-pay

## 2017-11-26 DIAGNOSIS — Z79899 Other long term (current) drug therapy: Secondary | ICD-10-CM | POA: Insufficient documentation

## 2017-11-26 DIAGNOSIS — K0510 Chronic gingivitis, plaque induced: Secondary | ICD-10-CM | POA: Insufficient documentation

## 2017-11-26 DIAGNOSIS — K0889 Other specified disorders of teeth and supporting structures: Secondary | ICD-10-CM | POA: Diagnosis present

## 2017-11-26 DIAGNOSIS — F17210 Nicotine dependence, cigarettes, uncomplicated: Secondary | ICD-10-CM | POA: Insufficient documentation

## 2017-11-26 MED ORDER — LIDOCAINE VISCOUS HCL 2 % MT SOLN: 5.0000 mL | Freq: Four times a day (QID) | OROMUCOSAL | 0 refills | Status: DC | PRN

## 2017-11-26 MED ORDER — TRAMADOL HCL 50 MG PO TABS: 50.0000 mg | ORAL_TABLET | Freq: Four times a day (QID) | ORAL | 0 refills | Status: DC | PRN

## 2017-11-26 MED ORDER — IBUPROFEN 600 MG PO TABS: 600.0000 mg | ORAL_TABLET | Freq: Four times a day (QID) | ORAL | 0 refills | Status: DC

## 2017-11-26 MED ORDER — CIPROFLOXACIN HCL 500 MG PO TABS: 500.0000 mg | ORAL_TABLET | Freq: Two times a day (BID) | ORAL | 0 refills | Status: DC

## 2017-11-26 NOTE — Discharge Instructions (Addendum)
May choose from list of dental clinics below. OPTIONS FOR DENTAL FOLLOW UP CARE  [place] Department of Health and Human Services - Local Safety Net Dental Clinics [email or web address]   [facility] [phone])  [facility] [phone])  [facility] [phone])  [facility] [phone])  [facility] [phone]) This clinic caters to the indigent population and is on a lottery system. Location: [employer] [facility], [employer], [address], [place] Clinic Hours: Wednesdays from 6pm - 9pm, patients seen by a lottery system. For dates, call or go to [email or web address] Services: Cleanings, fillings and simple extractions. Payment Options: DENTAL WORK IS FREE OF CHARGE. Bring proof of income or support. Best way to get seen: Arrive at 5:15 pm - this is a lottery, NOT first come/first serve, so arriving earlier will not increase your chances of being seen.     [facility] [phone] Select option 1 for emergencies   Location: [facility], [place], [address], [place] Clinic Hours: No walk-ins accepted - call the day before to schedule an appointment. Check in times are 9:30 am and 1:30 pm. Services: Simple extractions, temporary fillings, pulpectomy/pulp debridement, uncomplicated abscess drainage. Payment Options: PAYMENT IS DUE AT THE TIME OF SERVICE.  Fee is usually $100-200, additional surgical procedures (e.g. abscess drainage) may be extra. Cash, checks, Visa/MasterCard accepted.  Can file Medicaid if patient is covered for dental - patient should call case worker to check. No discount for [facility] patients. Best way to get seen: MUST call the day before and get onto the schedule. Can usually be seen the next 1-2 days. No walk-ins accepted.      [facility] [phone]   Location: [facility], [address] [place] Clinic Hours: M, W, Th, F 8am or 1:30pm, Tues 9a or 1:30 - first come/first served. Services: Simple extractions, temporary fillings, uncomplicated abscess drainage.  You do not need to be an [facility] resident. Payment Options: PAYMENT IS DUE AT THE TIME OF SERVICE. Dental insurance, otherwise sliding scale - bring proof of income or support. Depending on income and treatment needed, cost is usually $50-200. Best way to get seen: Arrive early as it is first come/first served.     [facility] [phone]   Location: [address] Clinic Hours: Mon-Thu 8a-5p Services: Most basic dental services including extractions and fillings. Payment Options: PAYMENT IS DUE AT THE TIME OF SERVICE. Sliding scale, up to 50% off - bring proof if income or support. Medicaid with dental option accepted. Best way to get seen: Call to schedule an appointment, can usually be seen within 2 weeks OR they will try to see walk-ins - show up at 8a or 2p (you may have to wait).     [facility] [phone] [place] RESIDENTS ONLY   Location: [facility], [address] [place], [place] [address] Clinic Hours: By appointment only. Monday - Thursday 8am-5pm, Friday 8am-12pm Services: Cleanings, fillings, extractions. Payment Options: PAYMENT IS DUE AT THE TIME OF SERVICE. Cash, Visa or MasterCard. Sliding scale - $30 minimum per service. Best way to get seen: Come in to office, complete packet and make an appointment - need proof of income or support monies for each household member and proof of [facility] residence. Usually takes about a month to get in.     [facility]  [facility] °[phone] °  °Location: °[address] [place] °Clinic Hours: Walk-in Urgent Care  Dental Services are offered Monday-Friday mornings only. °The numbers of emergencies accepted daily is limited to the number of °providers available. °Maximum 15 - Mondays, Wednesdays & Thursdays °Maximum 10 - Tuesdays & Fridays °Services: °You do not need to be a [place] resident to be seen for a dental emergency. °Emergencies are defined as pain, swelling, abnormal bleeding, or dental trauma. Walkins will receive x-rays if needed. °NOTE: Dental cleaning is not an emergency. °Payment Options: °PAYMENT IS DUE AT THE TIME OF SERVICE. °Minimum co-pay is $40.00 for uninsured patients. °Minimum co-pay is $3.00 for Medicaid with dental coverage. °Dental Insurance is accepted and must be presented at time of visit. °Medicare does not cover dental. °Forms of payment: Cash, credit card, checks. °Best way to get seen: °If not previously registered with the clinic, walk-in dental registration begins at 7:15 am and is on a first come/first serve basis. °If previously registered with the clinic, call to make an appointment. °  °  °[facility] °[phone] °[place] RESIDENTS ONLY °  °Location: °[address], [place], [place] °Clinic Hours: °Mon-Thu 10a-2p °Services: Extractions only! °Payment Options: °FREE (donations accepted) - bring proof of income or support °Best way to get seen: °Call and schedule an appointment OR come at 8am on the 1st Monday of every month (except for holidays) when it is first come/first served. °  °  °[facility] °[phone] °  °Location: °[address], [place] °Clinic Hours: °Friday mornings °Services, Payment Options, Best way to get seen: °Call for info ° °

## 2017-11-26 NOTE — ED Triage Notes (Signed)
Pt states she woke up with dental pain and swelling on right side.

## 2017-11-26 NOTE — ED Provider Notes (Signed)
[facility] Emergency Department Provider Note   ____________________________________________   First MD Initiated Contact with Patient 11/26/17 1415     (approximate)  I have reviewed the triage vital signs and the nursing notes.   HISTORY  Chief Complaint Dental Pain    HPI Kim Kidd is a 32 y.o. female patient waking with dental pain and gingival edema.  Patient has a fractured tooth and is waiting for dental appointment.  Patient rates the pain as a 9/10.  Patient described pain is "aching".  No palliative measure for complaint.  Past Medical History:  Diagnosis Date  . History of blood transfusion   . History of preterm labor     Patient Active Problem List   Diagnosis Date Noted  . Postoperative state 05/02/2017  . Uterine contractions during pregnancy 05/01/2017  . Premature uterine contractions 04/13/2017  . False labor after 37 weeks of gestation without delivery 04/13/2017  . Abdominal pain 03/31/2017  . Insufficient prenatal care 03/07/2016  . 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 Postpartum care following vaginal delivery 03/07/2016    Past Surgical History:  Procedure Laterality Date  . APPENDECTOMY    . BLADDER REPAIR N/A 05/02/2017   Procedure: BLADDER REPAIR;  Surgeon: Schermerhorn, Ihor Austin, MD;  Location: [facility] ORS;  Service: Obstetrics;  Laterality: N/A;  . CESAREAN SECTION N/A 05/02/2017   Procedure: CESAREAN SECTION;  Surgeon: Feliberto Gottron Ihor Austin, MD;  Location: [facility] ORS;  Service: Obstetrics;  Laterality: N/A;  . CHOLECYSTECTOMY    . NEPHRECTOMY Left     Prior to Admission medications   Medication Sig Start Date End Date Taking? Authorizing Provider  ciprofloxacin (CIPRO) 500 MG tablet Take 1 tablet (500 mg total) by mouth 2 (two) times daily. 11/26/17   Joni Reining, PA-C  dicyclomine (BENTYL) 20 MG tablet Take 1 tablet (20 mg total) by mouth 3 (three) times daily as needed for spasms. 09/07/17 09/07/18  Menshew,  Charlesetta Ivory, PA-C  docusate sodium (COLACE) 100 MG capsule Take 1 tablet once or twice daily as needed for constipation while taking narcotic pain medicine Patient not taking: Reported on 07/28/2017 05/14/17   Loleta Rose, MD  HYDROcodone-acetaminophen (NORCO) 5-325 MG tablet Take 1 tablet by mouth every 6 (six) hours as needed. 09/07/17   Menshew, Charlesetta Ivory, PA-C  ibuprofen (ADVIL,MOTRIN) 600 MG tablet Take 1 tablet (600 mg total) by mouth every 6 (six) hours. 11/26/17   Joni Reining, PA-C  lidocaine (XYLOCAINE) 2 % solution Use as directed 5 mLs in the mouth or throat every 6 (six) hours as needed for mouth pain. 11/26/17   Joni Reining, PA-C  naproxen (NAPROSYN) 500 MG tablet Take 1 tablet (500 mg total) by mouth 2 (two) times daily with a meal. 10/26/17   Joni Reining, PA-C  nicotine (NICODERM CQ - DOSED IN MG/24 HOURS) 14 mg/24hr patch Place 1 patch (14 mg total) onto the skin daily. Patient not taking: Reported on 07/28/2017 05/06/17   Ward, Elenora Fender, MD  oxyCODONE (OXY IR/ROXICODONE) 5 MG immediate release tablet Take 1-2 tablets (5-10 mg total) by mouth every 6 (six) hours as needed for severe pain. Patient not taking: Reported on 07/28/2017 05/05/17   Ward, Elenora Fender, MD  oxyCODONE-acetaminophen (PERCOCET/ROXICET) 5-325 MG tablet Take 1 tablet by mouth every 4 (four) hours as needed for severe pain. Patient not taking: Reported on 07/28/2017 06/30/17   Merrily Brittle, MD  promethazine (PHENERGAN) 12.5 MG tablet Take 1 tablet (12.5 mg  total) by mouth every 6 (six) hours as needed for nausea or vomiting. 10/06/17   Willy Eddy, MD  traMADol (ULTRAM) 50 MG tablet Take 1 tablet (50 mg total) by mouth every 12 (twelve) hours as needed. 10/26/17   Joni Reining, PA-C  traMADol (ULTRAM) 50 MG tablet Take 1 tablet (50 mg total) by mouth every 6 (six) hours as needed. 11/26/17 11/26/18  Joni Reining, PA-C  triamcinolone ointment (KENALOG) 0.1 % Apply 1 application topically 2 (two) times  daily. 09/07/17   Menshew, Charlesetta Ivory, PA-C    Allergies Penicillins  Family History  Problem Relation Age of Onset  . Hypertension Maternal Grandmother   . Cancer Paternal Grandmother   . Cancer Paternal Grandfather     Social History Social History   Tobacco Use  . Smoking status: Current Some Day Smoker    Packs/day: 1.00    Years: 15.00    Pack years: 15.00    Types: Cigarettes  . Smokeless tobacco: Never Used  Substance Use Topics  . Alcohol use: No  . Drug use: No    Review of Systems Constitutional: No fever/chills Eyes: No visual changes. ENT: Dental pain.   Cardiovascular: Denies chest pain. Respiratory: Denies shortness of breath. Gastrointestinal: No abdominal pain.  No nausea, no vomiting.  No diarrhea.  No constipation. Genitourinary: Negative for dysuria. Musculoskeletal: Negative for back pain. Skin: Negative for rash. Neurological: Negative for headaches, focal weakness or numbness. Allergic/Immunilogical: Penicillin ____________________________________________   PHYSICAL EXAM:  VITAL SIGNS: ED Triage Vitals [11/26/17 1342]  Enc Vitals Group     BP      Pulse      Resp      Temp      Temp src      SpO2      Weight 150 lb (68 kg)     Height 5\' 7"  (1.702 m)     Head Circumference      Peak Flow      Pain Score 9     Pain Loc      Pain Edu?      Excl. in GC?    Constitutional: Alert and oriented. Well appearing and in no acute distress. Eyes: Conjunctivae are normal. PERRL. EOMI. Head: Atraumatic. Nose: No congestion/rhinnorhea. Mouth/Throat: General edema over teeth #12.   Neck: No stridor. Hematological/Lymphatic/Immunilogical: No cervical lymphadenopathy. Cardiovascular: Normal rate, regular rhythm. Grossly normal heart sounds.  Good peripheral circulation. Respiratory: Normal respiratory effort.  No retractions. Lungs CTAB. Skin:  Skin is warm, dry and intact. No rash noted. Psychiatric: Mood and affect are normal. Speech and  behavior are normal.  ____________________________________________   LABS (all labs ordered are listed, but only abnormal results are displayed)  Labs Reviewed - No data to display ____________________________________________  EKG   ____________________________________________  RADIOLOGY  ED MD interpretation:    Official radiology report(s): No results found.  ____________________________________________   PROCEDURES  Procedure(s) performed: None  Procedures  Critical Care performed: No  ____________________________________________   INITIAL IMPRESSION / ASSESSMENT AND PLAN / ED COURSE  As part of my medical decision making, I reviewed the following data within the electronic MEDICAL RECORD NUMBER    Dental pain secondary to gingivitis.  Patient given discharge care instructions.  Patient given list of dental clinics for follow-up care.  Patient advised take medication as directed.      ____________________________________________   FINAL CLINICAL IMPRESSION(S) / ED DIAGNOSES  Final diagnoses:  Gingivitis     ED  Discharge Orders         Ordered    ciprofloxacin (CIPRO) 500 MG tablet  2 times daily     11/26/17 1438    lidocaine (XYLOCAINE) 2 % solution  Every 6 hours PRN     11/26/17 1438    traMADol (ULTRAM) 50 MG tablet  Every 6 hours PRN     11/26/17 1438    ibuprofen (ADVIL,MOTRIN) 600 MG tablet  Every 6 hours     11/26/17 1438           Note:  This document was prepared using Dragon voice recognition software and may include unintentional dictation errors.    Joni Reining, PA-C 11/26/17 1441    Sharman Cheek, MD 11/27/17 2249

## 2017-12-22 DIAGNOSIS — F17210 Nicotine dependence, cigarettes, uncomplicated: Secondary | ICD-10-CM | POA: Insufficient documentation

## 2017-12-22 DIAGNOSIS — R600 Localized edema: Secondary | ICD-10-CM | POA: Diagnosis present

## 2017-12-22 DIAGNOSIS — K047 Periapical abscess without sinus: Secondary | ICD-10-CM | POA: Insufficient documentation

## 2017-12-22 DIAGNOSIS — Z79899 Other long term (current) drug therapy: Secondary | ICD-10-CM | POA: Diagnosis not present

## 2017-12-22 MED ORDER — SODIUM CHLORIDE 0.9 % IV BOLUS
1000.0000 mL | Freq: Once | INTRAVENOUS | Status: AC
  Administered 2017-12-23: 1000 mL via INTRAVENOUS

## 2017-12-22 NOTE — ED Triage Notes (Signed)
Patient has marked facial swelling to left eye and left jaw. Patient believes her upper lip is swollen. Patient reports decreased visual acuity left eye.   Patient reports toothache/possible infection beginning Tuesday. Patient reports facial swelling beginning this AM.

## 2017-12-23 ENCOUNTER — Emergency Department
Admission: EM | Admit: 2017-12-23 | Discharge: 2017-12-23 | Disposition: A | Discharge status: Home / Self Care | Payer: Medicaid Other | Attending: Emergency Medicine | Admitting: Emergency Medicine

## 2017-12-23 ENCOUNTER — Encounter: Payer: Self-pay | Admitting: Radiology

## 2017-12-23 ENCOUNTER — Emergency Department: Payer: Medicaid Other

## 2017-12-23 DIAGNOSIS — R220 Localized swelling, mass and lump, head: Secondary | ICD-10-CM

## 2017-12-23 DIAGNOSIS — K047 Periapical abscess without sinus: Secondary | ICD-10-CM

## 2017-12-23 HISTORY — DX: Unspecified ovarian cyst, unspecified side: N83.209

## 2017-12-23 HISTORY — DX: Hepatomegaly, not elsewhere classified: R16.0

## 2017-12-23 LAB — URINALYSIS, COMPLETE (UACMP) WITH MICROSCOPIC
BILIRUBIN URINE: NEGATIVE
Bacteria, UA: NONE SEEN
GLUCOSE, UA: NEGATIVE mg/dL
Hgb urine dipstick: NEGATIVE
KETONES UR: 20 mg/dL — AB
LEUKOCYTES UA: NEGATIVE
Nitrite: NEGATIVE
PROTEIN: 30 mg/dL — AB
Specific Gravity, Urine: 1.019 (ref 1.005–1.030)
pH: 5 (ref 5.0–8.0)

## 2017-12-23 LAB — COMPREHENSIVE METABOLIC PANEL
ALT: 16 U/L (ref 0–44)
AST: 19 U/L (ref 15–41)
Albumin: 4.4 g/dL (ref 3.5–5.0)
Alkaline Phosphatase: 37 U/L — ABNORMAL LOW (ref 38–126)
Anion gap: 8 (ref 5–15)
BUN: 9 mg/dL (ref 6–20)
CALCIUM: 8.8 mg/dL — AB (ref 8.9–10.3)
CO2: 25 mmol/L (ref 22–32)
CREATININE: 0.72 mg/dL (ref 0.44–1.00)
Chloride: 106 mmol/L (ref 98–111)
GLUCOSE: 119 mg/dL — AB (ref 70–99)
POTASSIUM: 3.5 mmol/L (ref 3.5–5.1)
SODIUM: 139 mmol/L (ref 135–145)
TOTAL PROTEIN: 8.1 g/dL (ref 6.5–8.1)
Total Bilirubin: 1.8 mg/dL — ABNORMAL HIGH (ref 0.3–1.2)

## 2017-12-23 LAB — CBC WITH DIFFERENTIAL/PLATELET
Basophils Absolute: 0.1 10*3/uL (ref 0–0.1)
Basophils Relative: 1 %
Eosinophils Absolute: 0.7 10*3/uL (ref 0–0.7)
Eosinophils Relative: 8 %
HCT: 35.8 % (ref 35.0–47.0)
Hemoglobin: 12.1 g/dL (ref 12.0–16.0)
Lymphocytes Relative: 16 %
Lymphs Abs: 1.5 10*3/uL (ref 1.0–3.6)
MCH: 31.3 pg (ref 26.0–34.0)
MCHC: 33.7 g/dL (ref 32.0–36.0)
MCV: 92.9 fL (ref 80.0–100.0)
MONO ABS: 0.9 10*3/uL (ref 0.2–0.9)
MONOS PCT: 9 %
NEUTROS ABS: 6.3 10*3/uL (ref 1.4–6.5)
Neutrophils Relative %: 66 %
Platelets: 245 10*3/uL (ref 150–440)
RBC: 3.86 MIL/uL (ref 3.80–5.20)
RDW: 13 % (ref 11.5–14.5)
WBC: 9.4 10*3/uL (ref 3.6–11.0)

## 2017-12-23 LAB — LACTIC ACID, PLASMA: Lactic Acid, Venous: 0.6 mmol/L (ref 0.5–1.9)

## 2017-12-23 LAB — POCT PREGNANCY, URINE: Preg Test, Ur: NEGATIVE

## 2017-12-23 MED ORDER — DEXAMETHASONE SODIUM PHOSPHATE 10 MG/ML IJ SOLN
10.0000 mg | Freq: Once | INTRAMUSCULAR | Status: AC
  Administered 2017-12-23: 10 mg via INTRAVENOUS
  Filled 2017-12-23: qty 1

## 2017-12-23 MED ORDER — ONDANSETRON HCL 4 MG/2ML IJ SOLN
4.0000 mg | Freq: Once | INTRAMUSCULAR | Status: AC
  Administered 2017-12-23: 4 mg via INTRAVENOUS
  Filled 2017-12-23: qty 2

## 2017-12-23 MED ORDER — IOPAMIDOL (ISOVUE-300) INJECTION 61%: 75.0000 mL | Freq: Once | INTRAVENOUS | Status: DC | PRN

## 2017-12-23 MED ORDER — SODIUM CHLORIDE 0.9 % IV SOLN
2.0000 g | Freq: Once | INTRAVENOUS | Status: AC
  Administered 2017-12-23: 2 g via INTRAVENOUS
  Filled 2017-12-23: qty 20

## 2017-12-23 MED ORDER — IOHEXOL 300 MG/ML  SOLN
75.0000 mL | Freq: Once | INTRAMUSCULAR | Status: AC | PRN
  Administered 2017-12-23: 75 mL via INTRAVENOUS

## 2017-12-23 MED ORDER — OXYCODONE-ACETAMINOPHEN 5-325 MG PO TABS
1.0000 | ORAL_TABLET | Freq: Once | ORAL | Status: AC
  Administered 2017-12-23: 1 via ORAL
  Filled 2017-12-23: qty 1

## 2017-12-23 MED ORDER — CLINDAMYCIN PHOSPHATE 900 MG/50ML IV SOLN
900.0000 mg | Freq: Once | INTRAVENOUS | Status: AC
  Administered 2017-12-23: 900 mg via INTRAVENOUS
  Filled 2017-12-23: qty 50

## 2017-12-23 MED ORDER — PREDNISONE 10 MG (21) PO TBPK: ORAL_TABLET | ORAL | 0 refills | Status: DC

## 2017-12-23 MED ORDER — CLINDAMYCIN HCL 150 MG PO CAPS: 450.0000 mg | ORAL_CAPSULE | Freq: Four times a day (QID) | ORAL | 0 refills | Status: AC

## 2017-12-23 MED ORDER — MORPHINE SULFATE (PF) 4 MG/ML IV SOLN
4.0000 mg | Freq: Once | INTRAVENOUS | Status: AC
  Administered 2017-12-23: 4 mg via INTRAVENOUS
  Filled 2017-12-23: qty 1

## 2017-12-23 MED ORDER — CEFTRIAXONE SODIUM 1 G IJ SOLR: 2.0000 g | Freq: Once | INTRAMUSCULAR | Status: DC

## 2017-12-23 NOTE — ED Provider Notes (Signed)
[facility] Emergency Department Provider Note  ___________________________________________   First MD Initiated Contact with Patient 12/23/17 0210     (approximate)  I have reviewed the triage vital signs and the nursing notes.   HISTORY  Chief Complaint Facial Swelling   HPI Kim Kidd is a 32 y.o. female with a history of dental abscess was presented with left-sided facial swelling worsening over the past day.  She reports severe pain to the left side of the face as well as decreased vision because of swelling periorbitally.  Patient also with fever.  Patient says that she has a dentist that she will be able to see Monday morning.  Denies any difficulty swallowing, breathing or feeling of her throat closing.   Past Medical History:  Diagnosis Date  . History of blood transfusion   . History of preterm labor   . Liver mass   . Ovarian cyst     Patient Active Problem List   Diagnosis Date Noted  . Postoperative state 05/02/2017  . Uterine contractions during pregnancy 05/01/2017  . Premature uterine contractions 04/13/2017  . False labor after 37 weeks of gestation without delivery 04/13/2017  . Abdominal pain 03/31/2017  . Insufficient prenatal care 03/07/2016  . 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 Postpartum care following vaginal delivery 03/07/2016    Past Surgical History:  Procedure Laterality Date  . APPENDECTOMY    . BLADDER REPAIR N/A 05/02/2017   Procedure: BLADDER REPAIR;  Surgeon: Schermerhorn, Ihor Austin, MD;  Location: [facility] ORS;  Service: Obstetrics;  Laterality: N/A;  . CESAREAN SECTION N/A 05/02/2017   Procedure: CESAREAN SECTION;  Surgeon: Feliberto Gottron Ihor Austin, MD;  Location: [facility] ORS;  Service: Obstetrics;  Laterality: N/A;  . CHOLECYSTECTOMY    . NEPHRECTOMY Left     Prior to Admission medications   Medication Sig Start Date End Date Taking? Authorizing Provider  ciprofloxacin (CIPRO) 500 MG tablet Take 1 tablet (500 mg  total) by mouth 2 (two) times daily. Patient not taking: Reported on 12/23/2017 11/26/17   Joni Reining, PA-C  dicyclomine (BENTYL) 20 MG tablet Take 1 tablet (20 mg total) by mouth 3 (three) times daily as needed for spasms. Patient not taking: Reported on 12/23/2017 09/07/17 09/07/18  Menshew, Charlesetta Ivory, PA-C  docusate sodium (COLACE) 100 MG capsule Take 1 tablet once or twice daily as needed for constipation while taking narcotic pain medicine Patient not taking: Reported on 07/28/2017 05/14/17   Loleta Rose, MD  HYDROcodone-acetaminophen (NORCO) 5-325 MG tablet Take 1 tablet by mouth every 6 (six) hours as needed. Patient not taking: Reported on 12/23/2017 09/07/17   Menshew, Charlesetta Ivory, PA-C  ibuprofen (ADVIL,MOTRIN) 600 MG tablet Take 1 tablet (600 mg total) by mouth every 6 (six) hours. 11/26/17   Joni Reining, PA-C  lidocaine (XYLOCAINE) 2 % solution Use as directed 5 mLs in the mouth or throat every 6 (six) hours as needed for mouth pain. 11/26/17   Joni Reining, PA-C  naproxen (NAPROSYN) 500 MG tablet Take 1 tablet (500 mg total) by mouth 2 (two) times daily with a meal. Patient not taking: Reported on 12/23/2017 10/26/17   Joni Reining, PA-C  nicotine (NICODERM CQ - DOSED IN MG/24 HOURS) 14 mg/24hr patch Place 1 patch (14 mg total) onto the skin daily. Patient not taking: Reported on 07/28/2017 05/06/17   Ward, Elenora Fender, MD  oxyCODONE (OXY IR/ROXICODONE) 5 MG immediate release tablet Take 1-2 tablets (5-10 mg total) by mouth  every 6 (six) hours as needed for severe pain. Patient not taking: Reported on 07/28/2017 05/05/17   Ward, Elenora Fender, MD  oxyCODONE-acetaminophen (PERCOCET/ROXICET) 5-325 MG tablet Take 1 tablet by mouth every 4 (four) hours as needed for severe pain. Patient not taking: Reported on 07/28/2017 06/30/17   Merrily Brittle, MD  promethazine (PHENERGAN) 12.5 MG tablet Take 1 tablet (12.5 mg total) by mouth every 6 (six) hours as needed for nausea or vomiting. Patient  not taking: Reported on 12/23/2017 10/06/17   Willy Eddy, MD  traMADol (ULTRAM) 50 MG tablet Take 1 tablet (50 mg total) by mouth every 12 (twelve) hours as needed. Patient not taking: Reported on 12/23/2017 10/26/17   Joni Reining, PA-C  traMADol (ULTRAM) 50 MG tablet Take 1 tablet (50 mg total) by mouth every 6 (six) hours as needed. 11/26/17 11/26/18  Joni Reining, PA-C  triamcinolone ointment (KENALOG) 0.1 % Apply 1 application topically 2 (two) times daily. 09/07/17   Menshew, Charlesetta Ivory, PA-C    Allergies Penicillins  Family History  Problem Relation Age of Onset  . Hypertension Maternal Grandmother   . Cancer Paternal Grandmother   . Cancer Paternal Grandfather     Social History Social History   Tobacco Use  . Smoking status: Current Some Day Smoker    Packs/day: 1.00    Years: 15.00    Pack years: 15.00    Types: Cigarettes  . Smokeless tobacco: Never Used  Substance Use Topics  . Alcohol use: No  . Drug use: No    Review of Systems  Constitutional: As above Eyes: No visual changes. ENT: As above Cardiovascular: Denies chest pain. Respiratory: Denies shortness of breath. Gastrointestinal: No abdominal pain.  No nausea, no vomiting.  No diarrhea.  No constipation. Genitourinary: Negative for dysuria. Musculoskeletal: Negative for back pain. Skin: Negative for rash. Neurological: Negative for headaches, focal weakness or numbness.   ____________________________________________   PHYSICAL EXAM:  VITAL SIGNS: ED Triage Vitals  Enc Vitals Group     BP 12/22/17 2347 137/75     Pulse Rate 12/22/17 2347 91     Resp 12/22/17 2347 18     Temp 12/22/17 2347 (!) 101 F (38.3 C)     Temp Source 12/22/17 2347 Oral     SpO2 12/22/17 2347 98 %     Weight 12/22/17 2348 152 lb 1.9 oz (69 kg)     Height --      Head Circumference --      Peak Flow --      Pain Score 12/22/17 2347 9     Pain Loc --      Pain Edu? --      Excl. in GC? --      Constitutional: Alert and oriented.  No acute distress.  Temperature retaken is 99.1.   Eyes: Conjunctivae are normal.  Periorbital swelling around the left eye which extends down to the upper lip. Head: Atraumatic. Nose: No congestion/rhinnorhea. Mouth/Throat: Mucous membranes are moist.  Poor dentition.  No swelling of tongue or structures of the posterior pharynx.  Patient speaks normal voice.  Controlling secretions.  No fluctuance palpated anterior to the upper teeth.  Upper lip is moderately swollen, bilaterally. Neck: No stridor.  No submandibular tenderness nor is there any submandibular tissue swelling.  Cardiovascular: Normal rate, regular rhythm. Grossly normal heart sounds.   Respiratory: Normal respiratory effort.  No retractions. Lungs CTAB. Gastrointestinal: Soft and nontender. No distention.  Musculoskeletal: No lower extremity  tenderness nor edema.  No joint effusions. Neurologic:  Normal speech and language. No gross focal neurologic deficits are appreciated. Skin:  Skin is warm, dry and intact. No rash noted. Psychiatric: Mood and affect are normal. Speech and behavior are normal.  ____________________________________________   LABS (all labs ordered are listed, but only abnormal results are displayed)  Labs Reviewed  COMPREHENSIVE METABOLIC PANEL - Abnormal; Notable for the following components:      Result Value   Glucose, Bld 119 (*)    Calcium 8.8 (*)    Alkaline Phosphatase 37 (*)    Total Bilirubin 1.8 (*)    All other components within normal limits  URINALYSIS, COMPLETE (UACMP) WITH MICROSCOPIC - Abnormal; Notable for the following components:   Color, Urine YELLOW (*)    APPearance CLEAR (*)    Ketones, ur 20 (*)    Protein, ur 30 (*)    All other components within normal limits  CULTURE, BLOOD (ROUTINE X 2)  CULTURE, BLOOD (ROUTINE X 2)  LACTIC ACID, PLASMA  CBC WITH DIFFERENTIAL/PLATELET  LACTIC ACID, PLASMA  POC URINE PREG, ED  POCT  PREGNANCY, URINE   ____________________________________________  EKG   ____________________________________________  RADIOLOGY  17 mm odontogenic abscess overlying the left anterior maxillary alveolar bone.  Extensive inflammation of the superficial left face and orbital compartments.  No extension into the deep face orbital compartments. ____________________________________________   PROCEDURES  Procedure(s) performed:   Procedures  Critical Care performed:   ____________________________________________   INITIAL IMPRESSION / ASSESSMENT AND PLAN / ED COURSE  Pertinent labs & imaging results that were available during my care of the patient were reviewed by me and considered in my medical decision making (see chart for details).  DDX: Sepsis, dental abscess, facial infection, cellulitis, periorbital cellulitis, orbital cellulitis As part of my medical decision making, I reviewed the following data within the electronic MEDICAL RECORD NUMBER Notes from prior ED visits  ----------------------------------------- 3:52 AM on 12/23/2017 -----------------------------------------  Patient to be given IV clindamycin.  Pending ENT consult.  ----------------------------------------- 5:42 AM on 12/23/2017 -----------------------------------------  Discussed the patient's case with the ENT surgeon, Dr. Jenne Campus who recommends steroids, 12-day double strength Sterapred taper, clindamycin as well as follow-up with dentist.  Patient was given steroids as well as antibiotics in the emergency department and she says that the "pressure is going down."  She says that she has a dentist that she will be able to follow-up with this coming Monday.  Return down to 99.1.  Patient still without any respiratory compromise.  Will be discharged home.  She is understand the diagnosis as well as treatment and willing to comply. ____________________________________________   FINAL CLINICAL IMPRESSION(S) /  ED DIAGNOSES  Dental abscess.  NEW MEDICATIONS STARTED DURING THIS VISIT:  New Prescriptions   No medications on file     Note:  This document was prepared using Dragon voice recognition software and may include unintentional dictation errors.     Myrna Blazer, MD 12/23/17 [phone]

## 2017-12-23 NOTE — ED Notes (Signed)
First set of blood cultures drawn in triage, second set of blood cultures attempted to draw and drew blue top but vein blew before second red top could be drawn. MD and lab stated okay to send second blue top culture.

## 2018-01-01 LAB — CULTURE, BLOOD (ROUTINE X 2)
CULTURE: NO GROWTH
Culture: NO GROWTH
Special Requests: ADEQUATE

## 2018-01-20 ENCOUNTER — Encounter: Payer: Self-pay | Admitting: Emergency Medicine

## 2018-01-20 ENCOUNTER — Other Ambulatory Visit: Payer: Self-pay

## 2018-01-20 ENCOUNTER — Emergency Department
Admission: EM | Admit: 2018-01-20 | Discharge: 2018-01-20 | Disposition: A | Discharge status: Home / Self Care | Payer: Medicaid Other | Attending: Emergency Medicine | Admitting: Emergency Medicine

## 2018-01-20 DIAGNOSIS — F17210 Nicotine dependence, cigarettes, uncomplicated: Secondary | ICD-10-CM | POA: Diagnosis not present

## 2018-01-20 DIAGNOSIS — K029 Dental caries, unspecified: Secondary | ICD-10-CM | POA: Diagnosis not present

## 2018-01-20 DIAGNOSIS — K0889 Other specified disorders of teeth and supporting structures: Secondary | ICD-10-CM | POA: Diagnosis present

## 2018-01-20 MED ORDER — CLINDAMYCIN HCL 300 MG PO CAPS: 300.0000 mg | ORAL_CAPSULE | Freq: Three times a day (TID) | ORAL | 0 refills | Status: AC

## 2018-01-20 MED ORDER — LIDOCAINE VISCOUS HCL 2 % MT SOLN: 10.0000 mL | OROMUCOSAL | 0 refills | Status: DC | PRN

## 2018-01-20 NOTE — ED Triage Notes (Signed)
Pt to ED via POV c/o dental pain on the right side of mouth. Pt is in NAD

## 2018-01-20 NOTE — ED Notes (Signed)
See triage note  Presents with dental pain states pain is mainly to right lower no swelling  States has a dentist app on Thursday but pain has increased
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
## 2018-01-20 NOTE — ED Notes (Signed)
First Nurse Note: pt to ED c/o Dental pain

## 2018-02-18 DIAGNOSIS — F3132 Bipolar disorder, current episode depressed, moderate: Secondary | ICD-10-CM | POA: Diagnosis not present

## 2018-02-18 DIAGNOSIS — F411 Generalized anxiety disorder: Secondary | ICD-10-CM | POA: Diagnosis not present

## 2018-04-15 ENCOUNTER — Emergency Department: Payer: Medicaid Other

## 2018-04-15 ENCOUNTER — Emergency Department
Admission: EM | Admit: 2018-04-15 | Discharge: 2018-04-15 | Disposition: A | Discharge status: Home / Self Care | Payer: Medicaid Other | Attending: Emergency Medicine | Admitting: Emergency Medicine

## 2018-04-15 ENCOUNTER — Encounter: Payer: Self-pay | Admitting: Emergency Medicine

## 2018-04-15 DIAGNOSIS — F17210 Nicotine dependence, cigarettes, uncomplicated: Secondary | ICD-10-CM | POA: Diagnosis not present

## 2018-04-15 DIAGNOSIS — R1031 Right lower quadrant pain: Secondary | ICD-10-CM | POA: Diagnosis present

## 2018-04-15 DIAGNOSIS — R102 Pelvic and perineal pain: Secondary | ICD-10-CM | POA: Diagnosis not present

## 2018-04-15 DIAGNOSIS — N2889 Other specified disorders of kidney and ureter: Secondary | ICD-10-CM | POA: Diagnosis not present

## 2018-04-15 LAB — COMPREHENSIVE METABOLIC PANEL
ALK PHOS: 35 U/L — AB (ref 38–126)
ALT: 13 U/L (ref 0–44)
AST: 18 U/L (ref 15–41)
Albumin: 4.6 g/dL (ref 3.5–5.0)
Anion gap: 5 (ref 5–15)
BUN: 10 mg/dL (ref 6–20)
CALCIUM: 9.2 mg/dL (ref 8.9–10.3)
CO2: 27 mmol/L (ref 22–32)
Chloride: 108 mmol/L (ref 98–111)
Creatinine, Ser: 0.75 mg/dL (ref 0.44–1.00)
GFR calc Af Amer: >60 mL/min (ref >60)
GFR calc non Af Amer: >60 mL/min (ref >60)
GLUCOSE: 86 mg/dL (ref 70–99)
Potassium: 3.7 mmol/L (ref 3.5–5.1)
SODIUM: 140 mmol/L (ref 135–145)
Total Bilirubin: 0.9 mg/dL (ref 0.3–1.2)
Total Protein: 8.3 g/dL — ABNORMAL HIGH (ref 6.5–8.1)

## 2018-04-15 LAB — CHLAMYDIA/NGC RT PCR (ARMC ONLY)
Chlamydia Tr: NOT DETECTED
N gonorrhoeae: NOT DETECTED

## 2018-04-15 LAB — URINALYSIS, COMPLETE (UACMP) WITH MICROSCOPIC
BILIRUBIN URINE: NEGATIVE
Bacteria, UA: NONE SEEN
Glucose, UA: NEGATIVE mg/dL
HGB URINE DIPSTICK: NEGATIVE
KETONES UR: NEGATIVE mg/dL
LEUKOCYTES UA: NEGATIVE
Nitrite: NEGATIVE
Protein, ur: NEGATIVE mg/dL
SPECIFIC GRAVITY, URINE: 1.004 — AB (ref 1.005–1.030)
pH: 7 (ref 5.0–8.0)

## 2018-04-15 LAB — CBC
HEMATOCRIT: 37.3 % (ref 36.0–46.0)
HEMOGLOBIN: 12.1 g/dL (ref 12.0–15.0)
MCH: 30 pg (ref 26.0–34.0)
MCHC: 32.4 g/dL (ref 30.0–36.0)
MCV: 92.6 fL (ref 80.0–100.0)
Platelets: 255 10*3/uL (ref 150–400)
RBC: 4.03 MIL/uL (ref 3.87–5.11)
RDW: 13.3 % (ref 11.5–15.5)
WBC: 6.5 10*3/uL (ref 4.0–10.5)
nRBC: 0 % (ref 0.0–0.2)

## 2018-04-15 LAB — WET PREP, GENITAL
Clue Cells Wet Prep HPF POC: NONE SEEN
Sperm: NONE SEEN
Trich, Wet Prep: NONE SEEN
WBC, Wet Prep HPF POC: NONE SEEN
Yeast Wet Prep HPF POC: NONE SEEN

## 2018-04-15 LAB — POCT PREGNANCY, URINE: PREG TEST UR: NEGATIVE

## 2018-04-15 MED ORDER — KETOROLAC TROMETHAMINE 30 MG/ML IJ SOLN
30.0000 mg | Freq: Once | INTRAMUSCULAR | Status: AC
  Administered 2018-04-15: 30 mg via INTRAVENOUS
  Filled 2018-04-15: qty 1

## 2018-04-15 MED ORDER — MORPHINE SULFATE (PF) 4 MG/ML IV SOLN
4.0000 mg | Freq: Once | INTRAVENOUS | Status: AC
  Administered 2018-04-15: 4 mg via INTRAVENOUS

## 2018-04-15 MED ORDER — MORPHINE SULFATE (PF) 4 MG/ML IV SOLN
INTRAVENOUS | Status: AC
  Filled 2018-04-15: qty 1

## 2018-04-15 MED ORDER — TRAMADOL HCL 50 MG PO TABS: 50.0000 mg | ORAL_TABLET | Freq: Four times a day (QID) | ORAL | 0 refills | Status: AC | PRN

## 2018-04-15 MED ORDER — MORPHINE SULFATE (PF) 4 MG/ML IV SOLN
4.0000 mg | Freq: Once | INTRAVENOUS | Status: AC
  Administered 2018-04-15: 4 mg via INTRAVENOUS
  Filled 2018-04-15: qty 1

## 2018-04-15 MED ORDER — ONDANSETRON HCL 4 MG/2ML IJ SOLN
4.0000 mg | Freq: Once | INTRAMUSCULAR | Status: AC
  Administered 2018-04-15: 4 mg via INTRAVENOUS
  Filled 2018-04-15: qty 2

## 2018-04-15 NOTE — ED Triage Notes (Signed)
Pt reports pain to her right lower back that radiates around to her front and down her right leg. 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 Pt denies injuries.

## 2018-04-15 NOTE — ED Provider Notes (Signed)
[facility] Emergency Department Provider Note   ____________________________________________    I have reviewed the triage vital signs and the nursing notes.   HISTORY  Chief Complaint Back Pain and Leg Pain     HPI Kim Kidd is a 33 y.o. female who presents with complaints of right lower quadrant abdominal and pelvic pain which started yesterday.  Patient describes the pain sometimes seems to radiate to her back as well.  She reports it is worse with movement.  Reports increased vaginal discharge.  No fevers or chills.  Has not taken anything for this.  No nausea or vomiting.  Past Medical History:  Diagnosis Date  . History of blood transfusion   . History of preterm labor   . Liver mass   . Ovarian cyst     Patient Active Problem List   Diagnosis Date Noted  . Postoperative state 05/02/2017  . Uterine contractions during pregnancy 05/01/2017  . Premature uterine contractions 04/13/2017  . False labor after 37 weeks of gestation without delivery 04/13/2017  . Abdominal pain 03/31/2017  . Insufficient prenatal care 03/07/2016  . 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 Postpartum care following vaginal delivery 03/07/2016    Past Surgical History:  Procedure Laterality Date  . APPENDECTOMY    . BLADDER REPAIR N/A 05/02/2017   Procedure: BLADDER REPAIR;  Surgeon: Schermerhorn, Ihor Austinhomas J, MD;  Location: [facility] ORS;  Service: Obstetrics;  Laterality: N/A;  . CESAREAN SECTION N/A 05/02/2017   Procedure: CESAREAN SECTION;  Surgeon: Feliberto GottronSchermerhorn, Ihor Austinhomas J, MD;  Location: [facility] ORS;  Service: Obstetrics;  Laterality: N/A;  . CHOLECYSTECTOMY    . NEPHRECTOMY Left     Prior to Admission medications   Medication Sig Start Date End Date Taking? Authorizing Provider  ibuprofen (ADVIL,MOTRIN) 600 MG tablet Take 1 tablet (600 mg total) by mouth every 6 (six) hours. 11/26/17   Joni ReiningSmith, Ronald K, PA-C  lidocaine (XYLOCAINE) 2 % solution Use as directed 10 mLs in  the mouth or throat as needed for mouth pain. 01/20/18   Enid DerryWagner, Ashley, PA-C  predniSONE (STERAPRED UNI-PAK 21 TAB) 10 MG (21) TBPK tablet 12 day double strength sterapred taper 12/23/17   Schaevitz, Myra Rudeavid Matthew, MD  traMADol (ULTRAM) 50 MG tablet Take 1 tablet (50 mg total) by mouth every 6 (six) hours as needed. 04/15/18 04/15/19  Jene EveryKinner, Ladan Vanderzanden, MD  triamcinolone ointment (KENALOG) 0.1 % Apply 1 application topically 2 (two) times daily. 09/07/17   Menshew, Charlesetta IvoryJenise V Bacon, PA-C     Allergies Penicillins  Family History  Problem Relation Age of Onset  . Hypertension Maternal Grandmother   . Cancer Paternal Grandmother   . Cancer Paternal Grandfather     Social History Social History   Tobacco Use  . Smoking status: Current Some Day Smoker    Packs/day: 1.00    Years: 15.00    Pack years: 15.00    Types: Cigarettes  . Smokeless tobacco: Never Used  Substance Use Topics  . Alcohol use: No  . Drug use: No    Review of Systems  Constitutional: No fever/chills Eyes: No visual changes.  ENT: No sore throat. Cardiovascular: Denies chest pain. Respiratory: Denies shortness of breath. Gastrointestinal: As above Genitourinary: No dysuria, vaginal discharge as above Musculoskeletal: Negative for back pain. Skin: Negative for rash. Neurological: Negative for headaches   ____________________________________________   PHYSICAL EXAM:  VITAL SIGNS: ED Triage Vitals  Enc Vitals Group     BP 04/15/18 1556 130/76  Pulse Rate 04/15/18 1556 74     Resp 04/15/18 1556 20     Temp 04/15/18 1556 98.1 F (36.7 C)     Temp Source 04/15/18 1556 Oral     SpO2 04/15/18 1556 100 %     Weight 04/15/18 1557 68 kg (150 lb)     Height 04/15/18 1557 1.702 m (5\' 7" )     Head Circumference --      Peak Flow --      Pain Score 04/15/18 1557 7     Pain Loc --      Pain Edu? --      Excl. in GC? --     Constitutional: Alert and oriented  Nose: No  congestion/rhinnorhea. Mouth/Throat: Mucous membranes are moist.    Cardiovascular: Normal rate, regular rhythm. Grossly normal heart sounds.  Good peripheral circulation. Respiratory: Normal respiratory effort.  No retractions. Lungs CTAB. Gastrointestinal: Tenderness palpation over the right lower quadrant/right pelvis, no distention Genitourinary: No CMT, no cervicitis noted, no significant discharge Musculoskeletal:   Warm and well perfused Neurologic:  Normal speech and language. No gross focal neurologic deficits are appreciated.  Skin:  Skin is warm, dry and intact. No rash noted. Psychiatric: Mood and affect are normal. Speech and behavior are normal.  ____________________________________________   LABS (all labs ordered are listed, but only abnormal results are displayed)  Labs Reviewed  COMPREHENSIVE METABOLIC PANEL - Abnormal; Notable for the following components:      Result Value   Total Protein 8.3 (*)    Alkaline Phosphatase 35 (*)    All other components within normal limits  URINALYSIS, COMPLETE (UACMP) WITH MICROSCOPIC - Abnormal; Notable for the following components:   Color, Urine STRAW (*)    APPearance CLEAR (*)    Specific Gravity, Urine 1.004 (*)    All other components within normal limits  WET PREP, GENITAL  CHLAMYDIA/NGC RT PCR ([facility] ONLY)  CBC  POCT PREGNANCY, URINE   ____________________________________________  EKG  None ____________________________________________  RADIOLOGY  None ____________________________________________   PROCEDURES  Procedure(s) performed: No  Procedures   Critical Care performed: No ____________________________________________   INITIAL IMPRESSION / ASSESSMENT AND PLAN / ED COURSE  Pertinent labs & imaging results that were available during my care of the patient were reviewed by me and considered in my medical decision making (see chart for details).  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  Some increased vaginal discharge.  We will perform pelvic exam, send wet prep GC chlamydia obtain pelvic ultrasound and reevaluate.  Pelvic exam, ultrasound unremarkable  CT abdomen pelvis unremarkable.  On reexam patient reports feeling improved, she would like to go home I feel this is reasonable given reassuring work-up thus far.  Will try analgesics and she knows to return if any worsening of her symptoms    ____________________________________________   FINAL CLINICAL IMPRESSION(S) / ED DIAGNOSES  Final diagnoses:  Pelvic pain        Note:  This document was prepared using Dragon voice recognition software and may include unintentional dictation errors.   Jene EveryKinner, Rehmat Murtagh, MD 04/15/18 2122

## 2018-05-18 DIAGNOSIS — K029 Dental caries, unspecified: Secondary | ICD-10-CM | POA: Diagnosis not present

## 2018-05-18 DIAGNOSIS — K0889 Other specified disorders of teeth and supporting structures: Secondary | ICD-10-CM | POA: Diagnosis not present

## 2018-05-18 DIAGNOSIS — R6883 Chills (without fever): Secondary | ICD-10-CM | POA: Diagnosis not present

## 2018-05-31 ENCOUNTER — Emergency Department
Admission: EM | Admit: 2018-05-31 | Discharge: 2018-05-31 | Disposition: A | Discharge status: Home / Self Care | Payer: Medicaid Other | Attending: Emergency Medicine | Admitting: Emergency Medicine

## 2018-05-31 ENCOUNTER — Other Ambulatory Visit: Payer: Self-pay

## 2018-05-31 DIAGNOSIS — K047 Periapical abscess without sinus: Secondary | ICD-10-CM | POA: Insufficient documentation

## 2018-05-31 DIAGNOSIS — F17210 Nicotine dependence, cigarettes, uncomplicated: Secondary | ICD-10-CM | POA: Insufficient documentation

## 2018-05-31 DIAGNOSIS — R220 Localized swelling, mass and lump, head: Secondary | ICD-10-CM | POA: Insufficient documentation

## 2018-05-31 DIAGNOSIS — K0889 Other specified disorders of teeth and supporting structures: Secondary | ICD-10-CM | POA: Diagnosis present

## 2018-05-31 MED ORDER — CLINDAMYCIN HCL 300 MG PO CAPS: 300.0000 mg | ORAL_CAPSULE | Freq: Three times a day (TID) | ORAL | 0 refills | Status: AC

## 2018-05-31 MED ORDER — KETOROLAC TROMETHAMINE 10 MG PO TABS: 10.0000 mg | ORAL_TABLET | Freq: Four times a day (QID) | ORAL | 0 refills | Status: DC | PRN

## 2018-05-31 MED ORDER — MELOXICAM 15 MG PO TABS: 15.0000 mg | ORAL_TABLET | Freq: Every day | ORAL | 1 refills | Status: AC

## 2018-05-31 MED ORDER — CLINDAMYCIN HCL 300 MG PO CAPS: 300.0000 mg | ORAL_CAPSULE | Freq: Three times a day (TID) | ORAL | 0 refills | Status: DC

## 2018-05-31 NOTE — Discharge Instructions (Signed)
OPTIONS FOR DENTAL FOLLOW UP CARE ° °[place] Department of Health and Human Services - Local Safety Net Dental Clinics °http://www.ncdhhs.gov/dph/oralhealth/services/safetynetclinics.htm °  °[facility] (336-562-3123) ° °[facility] [place] ([phone]) ° °[facility] ([phone] ext [phone]) ° °[facility] ([phone]) ° °[facility] ([phone]) °This clinic caters to the indigent population and is on a lottery system. °Location: °[facility], [address], [place] °Clinic Hours: °Wednesdays from 6pm - 9pm, patients seen by a lottery system. °For dates, call or go to [email or web address].[facility].edu/[facility]/patients/Dental-[facility] °Services: °Cleanings, fillings and simple extractions. °Payment Options: °DENTAL WORK IS FREE OF CHARGE. Bring proof of income or support. °Best way to get seen: °Arrive at 5:15 pm - this is a lottery, NOT first come/first serve, so arriving earlier will not increase your chances of being seen. °  °  °[facility] °[phone] °Select option 1 for emergencies °  °Location: °[facility], [address], [place] °Clinic Hours: °No walk-ins accepted - call the day before to schedule an appointment. °Check in times are 9:30 am and 1:30 pm. °Services: °Simple extractions, temporary fillings, pulpectomy/pulp debridement, uncomplicated abscess drainage. °Payment Options: °PAYMENT IS DUE AT THE TIME OF SERVICE.  Fee is usually $100-200, additional surgical procedures (e.g. abscess drainage) may be extra. °Cash, checks, Visa/MasterCard accepted.  Can file Medicaid if patient is covered for dental - patient should call case worker to check. °No discount for [facility] patients. °Best way to get seen: °MUST call the day before and get onto the schedule. Can usually be seen the next 1-2 days. No walk-ins accepted. °  °  °[facility] °[phone] °   °Location: °[facility], [address], [place] °Clinic Hours: °M, W, Th, F 8am or 1:30pm, Tues 9a or 1:30 - first come/first served. °Services: °Simple extractions, temporary fillings, uncomplicated abscess drainage.  You do not need to be an [place] resident. °Payment Options: °PAYMENT IS DUE AT THE TIME OF SERVICE. °Dental insurance, otherwise sliding scale - bring proof of income or support. °Depending on income and treatment needed, cost is usually $50-200. °Best way to get seen: °Arrive early as it is first come/first served. °  °  °[facility] °[phone] °  °Location: °[address] °Clinic Hours: °Mon-Thu 8a-5p °Services: °Most basic dental services including extractions and fillings. °Payment Options: °PAYMENT IS DUE AT THE TIME OF SERVICE. °Sliding scale, up to 50% off - bring proof if income or support. °Medicaid with dental option accepted. °Best way to get seen: °Call to schedule an appointment, can usually be seen within 2 weeks OR they will try to see walk-ins - show up at 8a or 2p (you may have to wait). °  °  °[facility] °[phone] °[place] RESIDENTS ONLY °  °Location: °[facility], [address], [place], [place] [address] °Clinic Hours: By appointment only. °Monday - Thursday 8am-5pm, Friday 8am-12pm °Services: Cleanings, fillings, extractions. °Payment Options: °PAYMENT IS DUE AT THE TIME OF SERVICE. °Cash, Visa or MasterCard. Sliding scale - $30 minimum per service. °Best way to get seen: °Come in to office, complete packet and make an appointment - need proof of income °or support monies for each household member and proof of [place] residence. °Usually takes about a month to get in. °  °  °[facility] °[phone] °  °Location: °[address]   [place] °Clinic Hours: Walk-in Urgent Care Dental Services are offered Monday-Friday  mornings only. °The numbers of emergencies accepted daily is limited to the number of °providers available. °Maximum 15 - Mondays, Wednesdays & Thursdays °Maximum 10 - Tuesdays & Fridays °Services: °You do not need to be a [place] resident to be seen for a dental emergency. °Emergencies are defined as pain, swelling, abnormal bleeding, or dental trauma. Walkins will receive x-rays if needed. °NOTE: Dental cleaning is not an emergency. °Payment Options: °PAYMENT IS DUE AT THE TIME OF SERVICE. °Minimum co-pay is $40.00 for uninsured patients. °Minimum co-pay is $3.00 for Medicaid with dental coverage. °Dental Insurance is accepted and must be presented at time of visit. °Medicare does not cover dental. °Forms of payment: Cash, credit card, checks. °Best way to get seen: °If not previously registered with the clinic, walk-in dental registration begins at 7:15 am and is on a first come/first serve basis. °If previously registered with the clinic, call to make an appointment. °  °  °[facility] °[phone] °[place] RESIDENTS ONLY °  °Location: °[address], [place], [place] °Clinic Hours: °Mon-Thu 10a-2p °Services: Extractions only! °Payment Options: °FREE (donations accepted) - bring proof of income or support °Best way to get seen: °Call and schedule an appointment OR come at 8am on the 1st Monday of every month (except for holidays) when it is first come/first served. °  °  °[facility] °[phone] °  °Location: °[address], [place] °Clinic Hours: °Friday mornings °Services, Payment Options, Best way to get seen: °Call for info °

## 2018-05-31 NOTE — ED Notes (Signed)
Pt c/o right sided dental pain x3 days that has caused swelling to her face and headache. Pt states she contacted her dentist today but was told she would not be able to be seen until Wednesday.

## 2018-05-31 NOTE — ED Provider Notes (Signed)
[facility] Emergency Department Provider Note  ____________________________________________  Time seen: Approximately 10:34 PM  I have reviewed the triage vital signs and the nursing notes.   HISTORY  Chief Complaint Facial Swelling and Dental Pain    HPI Kim Kidd is a 33 y.o. female presents to the emergency department with pain in the vicinity of superior 2 and 3 and right upper jaw swelling that has increased throughout the day.  Patient has had dental abscesses in the past.  Patient reports that she has an upper an appointment with a local dentist in 5 days.  No fever or chills at home.  Patient has been taking Tylenol for pain.  No pain underneath the tongue or voice changes.  Patient is speaking in complete sentences and managing her own secretions.        Past Medical History:  Diagnosis Date  . History of blood transfusion   . History of preterm labor   . Liver mass   . Ovarian cyst     Patient Active Problem List   Diagnosis Date Noted  . Postoperative state 05/02/2017  . Uterine contractions during pregnancy 05/01/2017  . Premature uterine contractions 04/13/2017  . False labor after 37 weeks of gestation without delivery 04/13/2017  . Abdominal pain 03/31/2017  . Insufficient prenatal care 03/07/2016  . 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 Postpartum care following vaginal delivery 03/07/2016    Past Surgical History:  Procedure Laterality Date  . APPENDECTOMY    . BLADDER REPAIR N/A 05/02/2017   Procedure: BLADDER REPAIR;  Surgeon: Schermerhorn, Ihor Austin, MD;  Location: [facility] ORS;  Service: Obstetrics;  Laterality: N/A;  . CESAREAN SECTION N/A 05/02/2017   Procedure: CESAREAN SECTION;  Surgeon: Feliberto Gottron Ihor Austin, MD;  Location: [facility] ORS;  Service: Obstetrics;  Laterality: N/A;  . CHOLECYSTECTOMY    . NEPHRECTOMY Left     Prior to Admission medications   Medication Sig Start Date End Date Taking? Authorizing Provider   clindamycin (CLEOCIN) 300 MG capsule Take 1 capsule (300 mg total) by mouth 3 (three) times daily for 10 days. 05/31/18 06/10/18  Orvil Feil, PA-C  ibuprofen (ADVIL,MOTRIN) 600 MG tablet Take 1 tablet (600 mg total) by mouth every 6 (six) hours. 11/26/17   Joni Reining, PA-C  lidocaine (XYLOCAINE) 2 % solution Use as directed 10 mLs in the mouth or throat as needed for mouth pain. 01/20/18   Enid Derry, PA-C  meloxicam (MOBIC) 15 MG tablet Take 1 tablet (15 mg total) by mouth daily for 7 days. 05/31/18 06/07/18  Orvil Feil, PA-C  predniSONE (STERAPRED UNI-PAK 21 TAB) 10 MG (21) TBPK tablet 12 day double strength sterapred taper 12/23/17   Schaevitz, Myra Rude, MD  traMADol (ULTRAM) 50 MG tablet Take 1 tablet (50 mg total) by mouth every 6 (six) hours as needed. 04/15/18 04/15/19  Jene Every, MD  triamcinolone ointment (KENALOG) 0.1 % Apply 1 application topically 2 (two) times daily. 09/07/17   Menshew, Charlesetta Ivory, PA-C    Allergies Penicillins  Family History  Problem Relation Age of Onset  . Hypertension Maternal Grandmother   . Cancer Paternal Grandmother   . Cancer Paternal Grandfather     Social History Social History   Tobacco Use  . Smoking status: Current Some Day Smoker    Packs/day: 1.00    Years: 15.00    Pack years: 15.00    Types: Cigarettes  . Smokeless tobacco: Never Used  Substance Use Topics  .  Alcohol use: No  . Drug use: No     Review of Systems  Constitutional: No fever/chills Eyes: No visual changes. No discharge ENT: Patient has dental pain.  Cardiovascular: no chest pain. Respiratory: no cough. No SOB. Gastrointestinal: No abdominal pain.  No nausea, no vomiting.  No diarrhea.  No constipation. Genitourinary: Negative for dysuria. No hematuria Musculoskeletal: Negative for musculoskeletal pain. Skin: Negative for rash, abrasions, lacerations, ecchymosis. Neurological: Negative for headaches, focal weakness or  numbness.  ____________________________________________   PHYSICAL EXAM:  VITAL SIGNS: ED Triage Vitals  Enc Vitals Group     BP 05/31/18 1945 138/88     Pulse Rate 05/31/18 1945 68     Resp 05/31/18 1945 16     Temp 05/31/18 1945 98.9 F (37.2 C)     Temp Source 05/31/18 1945 Oral     SpO2 05/31/18 1945 98 %     Weight 05/31/18 1946 145 lb (65.8 kg)     Height 05/31/18 1946  (1.702 m)     Head Circumference --      Peak Flow --      Pain Score 05/31/18 1945 7     Pain Loc --      Pain Edu? --      Excl. in GC? --      Constitutional: Alert and oriented. Well appearing and in no acute distress. Eyes: Conjunctivae are normal. PERRL. EOMI. Head: Atraumatic. ENT:      Ears:       Nose: No congestion/rhinnorhea.      Mouth/Throat: Multiple dental caries.  Patient has broken teeth in the vicinity of superior 2 and 3.  Patient has right upper jaw swelling. Neck: No stridor.  No cervical spine tenderness to palpation. Cardiovascular: Normal rate, regular rhythm. Normal S1 and S2.  Good peripheral circulation. Respiratory: Normal respiratory effort without tachypnea or retractions. Lungs CTAB. Good air entry to the bases with no decreased or absent breath sounds. Skin:  Skin is warm, dry and intact. No rash noted. Psychiatric: Mood and affect are normal. Speech and behavior are normal. Patient exhibits appropriate insight and judgement.   ____________________________________________   LABS (all labs ordered are listed, but only abnormal results are displayed)  Labs Reviewed - No data to display ____________________________________________  EKG   ____________________________________________  RADIOLOGY   No results found.  ____________________________________________    PROCEDURES  Procedure(s) performed:    Procedures    Medications - No data to display   ____________________________________________   INITIAL IMPRESSION / ASSESSMENT AND PLAN  / ED COURSE  Pertinent labs & imaging results that were available during my care of the patient were reviewed by me and considered in my medical decision making (see chart for details).  Review of the [place] CSRS was performed in accordance of the [facility] prior to dispensing any controlled drugs.           Assessment and plan Dental abscess Patient presents to the emergency department with dental pain for the past several days.  Patient has broken superior 2 and 3.  Patient also has swelling of the right upper jaw consistent with a dental abscess.  Patient is allergic to amoxicillin.  She was discharged with clindamycin and meloxicam for pain.  Patient was advised to keep her appointment with local dentist.  All patient questions were answered.     ____________________________________________  FINAL CLINICAL IMPRESSION(S) / ED DIAGNOSES  Final diagnoses:  Dental abscess      NEW MEDICATIONS  STARTED DURING THIS VISIT:  ED Discharge Orders         Ordered    clindamycin (CLEOCIN) 300 MG capsule  3 times daily,   Status:  Discontinued     05/31/18 2126    ketorolac (TORADOL) 10 MG tablet  Every 6 hours PRN,   Status:  Discontinued     05/31/18 2126    clindamycin (CLEOCIN) 300 MG capsule  3 times daily     05/31/18 2132    meloxicam (MOBIC) 15 MG tablet  Daily     05/31/18 2132              This chart was dictated using voice recognition software/Dragon. Despite best efforts to proofread, errors can occur which can change the meaning. Any change was purely unintentional.    Orvil Feil, PA-C 05/31/18 2239    Sharyn Creamer, MD 06/01/18 [phone]

## 2018-05-31 NOTE — ED Triage Notes (Signed)
Pt with right upper jaw pain for "a couple of days". Pt states she awoke this am with right sided facial swelling and increased pain. Pt states she believes she has had a fever at home. Pt has been taking tylenol and motrin.

## 2018-07-02 ENCOUNTER — Other Ambulatory Visit: Payer: Self-pay

## 2018-07-02 ENCOUNTER — Encounter: Payer: Self-pay | Admitting: Emergency Medicine

## 2018-07-02 ENCOUNTER — Emergency Department
Admission: EM | Admit: 2018-07-02 | Discharge: 2018-07-02 | Disposition: A | Discharge status: Home / Self Care | Payer: Medicaid Other | Attending: Emergency Medicine | Admitting: Emergency Medicine

## 2018-07-02 DIAGNOSIS — K529 Noninfective gastroenteritis and colitis, unspecified: Secondary | ICD-10-CM

## 2018-07-02 DIAGNOSIS — F17210 Nicotine dependence, cigarettes, uncomplicated: Secondary | ICD-10-CM | POA: Diagnosis not present

## 2018-07-02 DIAGNOSIS — R1030 Lower abdominal pain, unspecified: Secondary | ICD-10-CM | POA: Diagnosis present

## 2018-07-02 LAB — URINALYSIS, COMPLETE (UACMP) WITH MICROSCOPIC
Bacteria, UA: NONE SEEN
Bilirubin Urine: NEGATIVE
Glucose, UA: NEGATIVE mg/dL
Hgb urine dipstick: NEGATIVE
Ketones, ur: NEGATIVE mg/dL
Leukocytes,Ua: NEGATIVE
Nitrite: NEGATIVE
Protein, ur: NEGATIVE mg/dL
Specific Gravity, Urine: 1.023 (ref 1.005–1.030)
pH: 6 (ref 5.0–8.0)

## 2018-07-02 LAB — CBC
HCT: 38.1 % (ref 36.0–46.0)
Hemoglobin: 12.5 g/dL (ref 12.0–15.0)
MCH: 30.6 pg (ref 26.0–34.0)
MCHC: 32.8 g/dL (ref 30.0–36.0)
MCV: 93.4 fL (ref 80.0–100.0)
Platelets: 292 10*3/uL (ref 150–400)
RBC: 4.08 MIL/uL (ref 3.87–5.11)
RDW: 13.8 % (ref 11.5–15.5)
WBC: 8.6 10*3/uL (ref 4.0–10.5)
nRBC: 0 % (ref 0.0–0.2)

## 2018-07-02 LAB — COMPREHENSIVE METABOLIC PANEL
ALT: 12 U/L (ref 0–44)
AST: 15 U/L (ref 15–41)
Albumin: 4.2 g/dL (ref 3.5–5.0)
Alkaline Phosphatase: 42 U/L (ref 38–126)
Anion gap: 8 (ref 5–15)
BUN: 11 mg/dL (ref 6–20)
CO2: 25 mmol/L (ref 22–32)
Calcium: 8.9 mg/dL (ref 8.9–10.3)
Chloride: 105 mmol/L (ref 98–111)
Creatinine, Ser: 0.76 mg/dL (ref 0.44–1.00)
GFR calc Af Amer: >60 mL/min (ref >60)
GFR calc non Af Amer: >60 mL/min (ref >60)
Glucose, Bld: 87 mg/dL (ref 70–99)
Potassium: 3.2 mmol/L — ABNORMAL LOW (ref 3.5–5.1)
Sodium: 138 mmol/L (ref 135–145)
Total Bilirubin: 0.6 mg/dL (ref 0.3–1.2)
Total Protein: 8 g/dL (ref 6.5–8.1)

## 2018-07-02 LAB — LIPASE, BLOOD: Lipase: 41 U/L (ref 11–51)

## 2018-07-02 LAB — POCT PREGNANCY, URINE: Preg Test, Ur: NEGATIVE

## 2018-07-02 MED ORDER — SODIUM CHLORIDE 0.9% FLUSH: 3.0000 mL | Freq: Once | INTRAVENOUS | Status: DC

## 2018-07-02 MED ORDER — CIPROFLOXACIN HCL 500 MG PO TABS
500.0000 mg | ORAL_TABLET | Freq: Once | ORAL | Status: AC
  Administered 2018-07-02: 500 mg via ORAL
  Filled 2018-07-02: qty 1

## 2018-07-02 MED ORDER — DICYCLOMINE HCL 10 MG PO CAPS
10.0000 mg | ORAL_CAPSULE | Freq: Once | ORAL | Status: AC
  Administered 2018-07-02: 10 mg via ORAL
  Filled 2018-07-02: qty 1

## 2018-07-02 MED ORDER — METRONIDAZOLE 500 MG PO TABS
500.0000 mg | ORAL_TABLET | Freq: Once | ORAL | Status: AC
  Administered 2018-07-02: 500 mg via ORAL
  Filled 2018-07-02: qty 1

## 2018-07-02 MED ORDER — DICYCLOMINE HCL 10 MG PO CAPS: 10.0000 mg | ORAL_CAPSULE | Freq: Four times a day (QID) | ORAL | 0 refills | Status: DC

## 2018-07-02 MED ORDER — HYDROCODONE-ACETAMINOPHEN 5-325 MG PO TABS
1.0000 | ORAL_TABLET | Freq: Once | ORAL | Status: AC
  Administered 2018-07-02: 1 via ORAL
  Filled 2018-07-02: qty 1

## 2018-07-02 MED ORDER — CIPROFLOXACIN HCL 500 MG PO TABS: 500.0000 mg | ORAL_TABLET | Freq: Two times a day (BID) | ORAL | 0 refills | Status: AC

## 2018-07-02 MED ORDER — METRONIDAZOLE 500 MG PO TABS: 500.0000 mg | ORAL_TABLET | Freq: Two times a day (BID) | ORAL | 0 refills | Status: DC

## 2018-07-02 NOTE — ED Provider Notes (Signed)
[facility] Emergency Department Provider Note  ____________________________________________  Time seen: Approximately 7:33 PM  I have reviewed the triage vital signs and the nursing notes.   HISTORY  Chief Complaint Abdominal Pain and Diarrhea    HPI Kim Kidd is a 33 y.o. female who presents the emergency department complaining of bilateral lower abdominal pain, nausea, vomiting, diarrhea.  Patient reports that she has been experiencing symptoms for the past 5 days.  She does have a history of colitis with similar symptoms.  She denies any fevers or chills, hematic emesis, bilious vomit, hematochezia.  Patient does not take any medications at home for this complaint.  She denies any vaginal bleeding or discharge, dysuria, polyuria, hematuria.         Past Medical History:  Diagnosis Date  . History of blood transfusion   . History of preterm labor   . Liver mass   . Ovarian cyst     Patient Active Problem List   Diagnosis Date Noted  . Postoperative state 05/02/2017  . Uterine contractions during pregnancy 05/01/2017  . Premature uterine contractions 04/13/2017  . False labor after 37 weeks of gestation without delivery 04/13/2017  . Abdominal pain 03/31/2017  . Insufficient prenatal care 03/07/2016  . 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 Postpartum care following vaginal delivery 03/07/2016    Past Surgical History:  Procedure Laterality Date  . APPENDECTOMY    . BLADDER REPAIR N/A 05/02/2017   Procedure: BLADDER REPAIR;  Surgeon: Schermerhorn, Ihor Austin, MD;  Location: [facility] ORS;  Service: Obstetrics;  Laterality: N/A;  . CESAREAN SECTION N/A 05/02/2017   Procedure: CESAREAN SECTION;  Surgeon: Feliberto Gottron Ihor Austin, MD;  Location: [facility] ORS;  Service: Obstetrics;  Laterality: N/A;  . CHOLECYSTECTOMY    . NEPHRECTOMY Left     Prior to Admission medications   Medication Sig Start Date End Date Taking? Authorizing Provider  ciprofloxacin  (CIPRO) 500 MG tablet Take 1 tablet (500 mg total) by mouth 2 (two) times daily for 7 days. 07/02/18 07/09/18  Story Vanvranken, Delorise Royals, PA-C  dicyclomine (BENTYL) 10 MG capsule Take 1 capsule (10 mg total) by mouth 4 (four) times daily for 14 days. 07/02/18 07/16/18  Kamrin Spath, Delorise Royals, PA-C  ibuprofen (ADVIL,MOTRIN) 600 MG tablet Take 1 tablet (600 mg total) by mouth every 6 (six) hours. Patient not taking: Reported on 07/02/2018 11/26/17   Joni Reining, PA-C  lidocaine (XYLOCAINE) 2 % solution Use as directed 10 mLs in the mouth or throat as needed for mouth pain. Patient not taking: Reported on 07/02/2018 01/20/18   Enid Derry, PA-C  metroNIDAZOLE (FLAGYL) 500 MG tablet Take 1 tablet (500 mg total) by mouth 2 (two) times daily. 07/02/18   Emelie Newsom, Delorise Royals, PA-C  predniSONE (STERAPRED UNI-PAK 21 TAB) 10 MG (21) TBPK tablet 12 day double strength sterapred taper Patient not taking: Reported on 07/02/2018 12/23/17   Myrna Blazer, MD  traMADol (ULTRAM) 50 MG tablet Take 1 tablet (50 mg total) by mouth every 6 (six) hours as needed. Patient not taking: Reported on 07/02/2018 04/15/18 04/15/19  Jene Every, MD  triamcinolone ointment (KENALOG) 0.1 % Apply 1 application topically 2 (two) times daily. Patient not taking: Reported on 07/02/2018 09/07/17   Menshew, Charlesetta Ivory, PA-C    Allergies Penicillins  Family History  Problem Relation Age of Onset  . Hypertension Maternal Grandmother   . Cancer Paternal Grandmother   . Cancer Paternal Grandfather     Social History Social History  Tobacco Use  . Smoking status: Current Some Day Smoker    Packs/day: 1.00    Years: 15.00    Pack years: 15.00    Types: Cigarettes  . Smokeless tobacco: Never Used  Substance Use Topics  . Alcohol use: No  . Drug use: No     Review of Systems  Constitutional: No fever/chills Eyes: No visual changes. No discharge ENT: No upper respiratory complaints. Cardiovascular: no chest  pain. Respiratory: no cough. No SOB. Gastrointestinal: Bilateral lower abdominal pain.  Positive for nausea, vomiting, diarrhea.  No constipation. Genitourinary: Negative for dysuria. No hematuria Musculoskeletal: Negative for musculoskeletal pain. Skin: Negative for rash, abrasions, lacerations, ecchymosis. Neurological: Negative for headaches, focal weakness or numbness. 10-point ROS otherwise negative.  ____________________________________________   PHYSICAL EXAM:  VITAL SIGNS: ED Triage Vitals  Enc Vitals Group     BP 07/02/18 1532 133/73     Pulse Rate 07/02/18 1532 99     Resp 07/02/18 1532 17     Temp 07/02/18 1532 98.7 F (37.1 C)     Temp Source 07/02/18 1532 Oral     SpO2 07/02/18 1532 100 %     Weight 07/02/18 1520 145 lb (65.8 kg)     Height 07/02/18 1520 5\' 7"  (1.702 m)     Head Circumference --      Peak Flow --      Pain Score 07/02/18 1520 7     Pain Loc --      Pain Edu? --      Excl. in GC? --      Constitutional: Alert and oriented. Well appearing and in no acute distress. Eyes: Conjunctivae are normal. PERRL. EOMI. Head: Atraumatic. ENT:      Ears:       Nose: No congestion/rhinnorhea.      Mouth/Throat: Mucous membranes are moist.  Neck: No stridor.    Cardiovascular: Normal rate, regular rhythm. Normal S1 and S2.  Good peripheral circulation. Respiratory: Normal respiratory effort without tachypnea or retractions. Lungs CTAB. Good air entry to the bases with no decreased or absent breath sounds. Gastrointestinal: No visible abnormality to the abdominal wall.  Bowel sounds 4 quadrants. Soft to palpation all quadrants.  Patient is mildly tender to palpation in bilateral lower quadrants.  No rebound tenderness, negative Rovsing's and obturator's.. No guarding or rigidity. No palpable masses. No distention. No CVA tenderness. Musculoskeletal: Full range of motion to all extremities. No gross deformities appreciated. Neurologic:  Normal speech and  language. No gross focal neurologic deficits are appreciated.  Skin:  Skin is warm, dry and intact. No rash noted. Psychiatric: Mood and affect are normal. Speech and behavior are normal. Patient exhibits appropriate insight and judgement.   ____________________________________________   LABS (all labs ordered are listed, but only abnormal results are displayed)  Labs Reviewed  COMPREHENSIVE METABOLIC PANEL - Abnormal; Notable for the following components:      Result Value   Potassium 3.2 (*)    All other components within normal limits  URINALYSIS, COMPLETE (UACMP) WITH MICROSCOPIC - Abnormal; Notable for the following components:   Color, Urine YELLOW (*)    APPearance HAZY (*)    All other components within normal limits  LIPASE, BLOOD  CBC  POC URINE PREG, ED  POCT PREGNANCY, URINE   ____________________________________________  EKG   ____________________________________________  RADIOLOGY   No results found.  ____________________________________________    PROCEDURES  Procedure(s) performed:    Procedures    Medications  sodium chloride  flush (NS) 0.9 % injection 3 mL (has no administration in time range)  metroNIDAZOLE (FLAGYL) tablet 500 mg (has no administration in time range)  ciprofloxacin (CIPRO) tablet 500 mg (has no administration in time range)  dicyclomine (BENTYL) capsule 10 mg (has no administration in time range)  HYDROcodone-acetaminophen (NORCO/VICODIN) 5-325 MG per tablet 1 tablet (has no administration in time range)     ____________________________________________   INITIAL IMPRESSION / ASSESSMENT AND PLAN / ED COURSE  Pertinent labs & imaging results that were available during my care of the patient were reviewed by me and considered in my medical decision making (see chart for details).  Review of the [place] CSRS was performed in accordance of the [facility] prior to dispensing any controlled drugs.         Patient's diagnosis is  consistent with colitis.  Patient presented to the emergency department with bilateral lower abdominal pain/cramping as well as nausea, vomiting, diarrhea.  Patient reports that she has a history of colitis and has had similar symptoms.  She is afebrile, denies any urinary symptoms.  No vaginal bleeding or discharge.  She denies any hematic hematochezia, hematic emesis, bilious vomit.  Overall, exam is reassuring.  Patient is mildly, diffusely tender to palpation of bilateral lower quadrants.  Differential includes colitis, bowel obstruction, diverticulitis, appendicitis, cholecystitis.  Given patient afebrile status, no elevated white blood cell count, mild bilateral lower quadrant tenderness, I have a low suspicion for appendicitis or diverticulitis.  I suspect that symptoms are most consistent with colitis.  I discussed imaging versus no imaging with the patient.  At this time, patient declines any imaging stating "I will take the medication just to feel better."  Given this, I will place the patient on Flagyl, Cipro, Bentyl.  She is to use probiotics at home for additional relief.  If any symptoms change to develop fever, localized abdominal pain or other symptoms patient is to return to the emergency department..  Patient is to follow-up with primary care as needed.  Patient is given ED precautions to return to the ED for any worsening or new symptoms.     ____________________________________________  FINAL CLINICAL IMPRESSION(S) / ED DIAGNOSES  Final diagnoses:  Colitis      NEW MEDICATIONS STARTED DURING THIS VISIT:  ED Discharge Orders         Ordered    metroNIDAZOLE (FLAGYL) 500 MG tablet  2 times daily     07/02/18 1939    ciprofloxacin (CIPRO) 500 MG tablet  2 times daily     07/02/18 1939    dicyclomine (BENTYL) 10 MG capsule  4 times daily     07/02/18 1939              This chart was dictated using voice recognition software/Dragon. Despite best efforts to proofread,  errors can occur which can change the meaning. Any change was purely unintentional.    Lanette HampshireCuthriell, Torah Pinnock D, PA-C 07/02/18 1940    Sharman CheekStafford, Phillip, MD 07/02/18 2014

## 2018-07-02 NOTE — ED Notes (Signed)
Pt is AOx4, vss, she c/o abdominal pain at 7/10 and she is in bed with rails upx2. She does not show any signs of distress.  We will continue to monitor the pt.

## 2018-07-02 NOTE — ED Triage Notes (Signed)
C/O diarrhea and lower abdominal pain x 5 days.  STates has had similar episode in the past and was told she was "boarderline Crohn's"/  AAOx3.  Skin warm and dry. NAD

## 2018-07-02 NOTE — ED Notes (Signed)
Pt verbalized understanding of d/c instructions, RX, and f/u care. No further questions at this time. Pt ambulatory to the exit with steady gait  

## 2018-08-27 DIAGNOSIS — F3132 Bipolar disorder, current episode depressed, moderate: Secondary | ICD-10-CM | POA: Diagnosis not present

## 2018-08-27 DIAGNOSIS — F331 Major depressive disorder, recurrent, moderate: Secondary | ICD-10-CM | POA: Diagnosis not present

## 2018-08-27 DIAGNOSIS — F411 Generalized anxiety disorder: Secondary | ICD-10-CM | POA: Diagnosis not present

## 2018-10-30 DIAGNOSIS — F3132 Bipolar disorder, current episode depressed, moderate: Secondary | ICD-10-CM | POA: Diagnosis not present

## 2018-10-30 DIAGNOSIS — F411 Generalized anxiety disorder: Secondary | ICD-10-CM | POA: Diagnosis not present

## 2018-11-22 DIAGNOSIS — O219 Vomiting of pregnancy, unspecified: Secondary | ICD-10-CM | POA: Diagnosis not present

## 2018-11-22 DIAGNOSIS — Z3A01 Less than 8 weeks gestation of pregnancy: Secondary | ICD-10-CM | POA: Diagnosis not present

## 2018-11-22 DIAGNOSIS — R1084 Generalized abdominal pain: Secondary | ICD-10-CM | POA: Diagnosis not present

## 2018-11-22 DIAGNOSIS — O23591 Infection of other part of genital tract in pregnancy, first trimester: Secondary | ICD-10-CM | POA: Diagnosis not present

## 2018-11-22 DIAGNOSIS — N760 Acute vaginitis: Secondary | ICD-10-CM | POA: Diagnosis not present

## 2018-11-22 DIAGNOSIS — O09291 Supervision of pregnancy with other poor reproductive or obstetric history, first trimester: Secondary | ICD-10-CM | POA: Diagnosis not present

## 2018-11-26 DIAGNOSIS — O9989 Other specified diseases and conditions complicating pregnancy, childbirth and the puerperium: Secondary | ICD-10-CM | POA: Diagnosis not present

## 2018-11-26 DIAGNOSIS — R1030 Lower abdominal pain, unspecified: Secondary | ICD-10-CM | POA: Diagnosis not present

## 2018-11-26 DIAGNOSIS — E860 Dehydration: Secondary | ICD-10-CM | POA: Diagnosis not present

## 2018-11-26 DIAGNOSIS — Z3A01 Less than 8 weeks gestation of pregnancy: Secondary | ICD-10-CM | POA: Diagnosis not present

## 2018-11-26 DIAGNOSIS — O30041 Twin pregnancy, dichorionic/diamniotic, first trimester: Secondary | ICD-10-CM | POA: Diagnosis not present

## 2018-11-26 DIAGNOSIS — O26811 Pregnancy related exhaustion and fatigue, first trimester: Secondary | ICD-10-CM | POA: Diagnosis not present

## 2018-11-26 DIAGNOSIS — O26841 Uterine size-date discrepancy, first trimester: Secondary | ICD-10-CM | POA: Diagnosis not present

## 2018-11-26 DIAGNOSIS — O219 Vomiting of pregnancy, unspecified: Secondary | ICD-10-CM | POA: Diagnosis not present

## 2018-11-26 DIAGNOSIS — O99331 Smoking (tobacco) complicating pregnancy, first trimester: Secondary | ICD-10-CM | POA: Diagnosis not present

## 2018-12-05 DIAGNOSIS — O09213 Supervision of pregnancy with history of pre-term labor, third trimester: Secondary | ICD-10-CM | POA: Diagnosis not present

## 2018-12-05 DIAGNOSIS — F419 Anxiety disorder, unspecified: Secondary | ICD-10-CM | POA: Diagnosis not present

## 2018-12-05 DIAGNOSIS — O0991 Supervision of high risk pregnancy, unspecified, first trimester: Secondary | ICD-10-CM | POA: Diagnosis not present

## 2018-12-05 DIAGNOSIS — Z87898 Personal history of other specified conditions: Secondary | ICD-10-CM | POA: Diagnosis not present

## 2018-12-05 DIAGNOSIS — O30041 Twin pregnancy, dichorionic/diamniotic, first trimester: Secondary | ICD-10-CM | POA: Diagnosis not present

## 2018-12-05 DIAGNOSIS — Z98891 History of uterine scar from previous surgery: Secondary | ICD-10-CM | POA: Diagnosis not present

## 2018-12-05 DIAGNOSIS — F329 Major depressive disorder, single episode, unspecified: Secondary | ICD-10-CM | POA: Diagnosis not present

## 2018-12-05 DIAGNOSIS — Z905 Acquired absence of kidney: Secondary | ICD-10-CM | POA: Diagnosis not present

## 2018-12-05 DIAGNOSIS — O99331 Smoking (tobacco) complicating pregnancy, first trimester: Secondary | ICD-10-CM | POA: Diagnosis not present

## 2018-12-15 ENCOUNTER — Other Ambulatory Visit: Payer: Self-pay

## 2018-12-15 ENCOUNTER — Emergency Department: Payer: Medicaid Other

## 2018-12-15 ENCOUNTER — Emergency Department
Admission: EM | Admit: 2018-12-15 | Discharge: 2018-12-15 | Disposition: A | Discharge status: Home / Self Care | Payer: Medicaid Other | Attending: Emergency Medicine | Admitting: Emergency Medicine

## 2018-12-15 DIAGNOSIS — M25562 Pain in left knee: Secondary | ICD-10-CM | POA: Diagnosis not present

## 2018-12-15 DIAGNOSIS — O9989 Other specified diseases and conditions complicating pregnancy, childbirth and the puerperium: Secondary | ICD-10-CM | POA: Diagnosis not present

## 2018-12-15 DIAGNOSIS — Z3A10 10 weeks gestation of pregnancy: Secondary | ICD-10-CM | POA: Diagnosis not present

## 2018-12-15 MED ORDER — LIDOCAINE 5 % EX PTCH: 1.0000 | MEDICATED_PATCH | Freq: Two times a day (BID) | CUTANEOUS | 0 refills | Status: DC

## 2018-12-15 NOTE — ED Notes (Signed)
Pt states having left knee pain with no injury. States when straightening her leg she feels like it is grinding. No swelling or redness noted. Has been taking ibuprofen and states not working.

## 2018-12-15 NOTE — ED Triage Notes (Signed)
Pt presents c/o left knee pain x1 week ago, worse at night.

## 2018-12-15 NOTE — ED Provider Notes (Signed)
[facility] Emergency Department Provider Note ____________________________________________  Time seen: 1712  I have reviewed the triage vital signs and the nursing notes.  HISTORY  Chief Complaint  Knee Pain  HPI Kim Kidd is a 33 y.o. female presents to the ED with complaint of 1 week onset of left knee pain.  She describe symptoms seem to be worse at night.  Patient denies any preceding injury, accident, trauma, fall patient also denies any history of pre-existing or ongoing knee pain.  She reports pain to the anterior knee the patella centrally worse with attempts at full for full extension and flexion.  She is denied any swelling or effusion to the knee.  She also denies any catching, click, lock, or give way pain.  She has been taking ibuprofen Tylenol intermittently for the pain, but reports she is approximately [redacted] weeks pregnant with a twin gestation.  She denies any other complaints at this time including fever, chest pain, shortness of breath, or abnormal vaginal bleeding.   Past Medical History:  Diagnosis Date  . History of blood transfusion   . History of preterm labor   . Liver mass   . Ovarian cyst     Patient Active Problem List   Diagnosis Date Noted  . Postoperative state 05/02/2017  . Uterine contractions during pregnancy 05/01/2017  . Premature uterine contractions 04/13/2017  . False labor after 37 weeks of gestation without delivery 04/13/2017  . Abdominal pain 03/31/2017  . Insufficient prenatal care 03/07/2016  . 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 Postpartum care following vaginal delivery 03/07/2016    Past Surgical History:  Procedure Laterality Date  . APPENDECTOMY    . BLADDER REPAIR N/A 05/02/2017   Procedure: BLADDER REPAIR;  Surgeon: Schermerhorn, Gwen Her, MD;  Location: [facility] ORS;  Service: Obstetrics;  Laterality: N/A;  . CESAREAN SECTION N/A 05/02/2017   Procedure: CESAREAN SECTION;  Surgeon: Ouida Sills Gwen Her, MD;   Location: [facility] ORS;  Service: Obstetrics;  Laterality: N/A;  . CHOLECYSTECTOMY    . NEPHRECTOMY Left     Prior to Admission medications   Medication Sig Start Date End Date Taking? Authorizing Provider  dicyclomine (BENTYL) 10 MG capsule Take 1 capsule (10 mg total) by mouth 4 (four) times daily for 14 days. 07/02/18 07/16/18  Cuthriell, Charline Bills, PA-C  ibuprofen (ADVIL,MOTRIN) 600 MG tablet Take 1 tablet (600 mg total) by mouth every 6 (six) hours. Patient not taking: Reported on 07/02/2018 11/26/17   Sable Feil, PA-C  lidocaine (LIDODERM) 5 % Place 1 patch onto the skin every 12 (twelve) hours. Remove & Discard patch within 12 hours or as directed by MD 12/15/18 12/15/19  Serina Nichter, Dannielle Karvonen, PA-C  lidocaine (XYLOCAINE) 2 % solution Use as directed 10 mLs in the mouth or throat as needed for mouth pain. Patient not taking: Reported on 07/02/2018 01/20/18   Laban Emperor, PA-C  metroNIDAZOLE (FLAGYL) 500 MG tablet Take 1 tablet (500 mg total) by mouth 2 (two) times daily. 07/02/18   Cuthriell, Charline Bills, PA-C  predniSONE (STERAPRED UNI-PAK 21 TAB) 10 MG (21) TBPK tablet 12 day double strength sterapred taper Patient not taking: Reported on 07/02/2018 12/23/17   Orbie Pyo, MD  traMADol (ULTRAM) 50 MG tablet Take 1 tablet (50 mg total) by mouth every 6 (six) hours as needed. Patient not taking: Reported on 07/02/2018 04/15/18 04/15/19  Lavonia Drafts, MD  triamcinolone ointment (KENALOG) 0.1 % Apply 1 application topically 2 (two) times daily. Patient not taking:  Reported on 07/02/2018 09/07/17   Theadora Noyes, Charlesetta IvoryJenise V Bacon, PA-C    Allergies Penicillins  Family History  Problem Relation Age of Onset  . Hypertension Maternal Grandmother   . Cancer Paternal Grandmother   . Cancer Paternal Grandfather     Social History Social History   Tobacco Use  . Smoking status: Current Some Day Smoker    Packs/day: 1.00    Years: 15.00    Pack years: 15.00    Types: Cigarettes  .  Smokeless tobacco: Never Used  Substance Use Topics  . Alcohol use: No  . Drug use: No    Review of Systems  Constitutional: Negative for fever. Cardiovascular: Negative for chest pain. Respiratory: Negative for shortness of breath. Gastrointestinal: Negative for abdominal pain, vomiting and diarrhea. Genitourinary: Negative for dysuria. Musculoskeletal: Negative for back pain.  Left knee pain as well.   Skin: Negative for rash. Neurological: Negative for headaches, focal weakness or numbness. ____________________________________________  PHYSICAL EXAM:  VITAL SIGNS: ED Triage Vitals [12/15/18 1548]  Enc Vitals Group     BP 127/67     Pulse Rate 85     Resp 14     Temp 99.4 F (37.4 C)     Temp Source Oral     SpO2 100 %     Weight      Height      Head Circumference      Peak Flow      Pain Score 7     Pain Loc      Pain Edu?      Excl. in GC?     Constitutional: Alert and oriented. Well appearing and in no distress. Head: Normocephalic and atraumatic. Eyes: Conjunctivae are normal. Normal extraocular movements Cardiovascular: Normal rate, regular rhythm. Normal distal pulses. Respiratory: Normal respiratory effort.  Musculoskeletal: Left knee without obvious deformity, dislocation, or effusion.  Patient with normal flexion extension range the knee on exam.  No popliteal space fullness is noted.  Patient without any significant valgus or varus joint stress on exam.  Negative anterior/posterior drawer sign.  No indication of any internal derangement on exam.  Nontender with normal range of motion in all extremities.  Neurologic:  Normal gait without ataxia. Normal speech and language. No gross focal neurologic deficits are appreciated. Skin:  Skin is warm, dry and intact. No rash noted. ____________________________________________    RADIOLOGY  deferred ____________________________________________  PROCEDURES  Procedures ____________________________________________  INITIAL IMPRESSION / ASSESSMENT AND PLAN / ED COURSE  Patient with ED evaluation of 1 week complaint of left knee pain.  Patient is without any pre-existing injury or trauma.  She is [redacted] weeks pregnant with a twin gestation and has been taking ibuprofen for her pain relief.  She is advised to immediately discontinue all and any NSAIDs at this time.  She is advised Tylenol is safe for pain relief during pregnancy.  To be discharged with a prescription for Lidoderm patch to use in addition to topical ice and/or heat application.  We also discussed possibility of x-ray imaging at this time, but given her nontraumatic knee pain onset, discussed that the likelihood of any findings at this time would be low.  Patient was agreeable to defer x-rays at this time.  She is discharged to follow-up with primary provider or OB provider for ongoing knee pain.  She may use an over-the-counter neoprene sleeve for support.  Return precautions have been reviewed.  Burnell BlanksKrystle Fuquay was evaluated in Emergency Department on 12/15/2018 for the  symptoms described in the history of present illness. She was evaluated in the context of the global COVID-19 pandemic, which necessitated consideration that the patient might be at risk for infection with the SARS-CoV-2 virus that causes COVID-19. Institutional protocols and algorithms that pertain to the evaluation of patients at risk for COVID-19 are in a state of rapid change based on information released by regulatory bodies including the CDC and federal and state organizations. These policies and algorithms were followed during the patient's care in the ED. ____________________________________________  FINAL CLINICAL IMPRESSION(S) / ED DIAGNOSES  Final diagnoses:  Acute pain of left knee      Karmen Stabs, Charlesetta Ivory, PA-C 12/15/18  1918    Arnaldo Natal, MD 12/15/18 2037

## 2018-12-15 NOTE — Discharge Instructions (Addendum)
Discontinue use of any and all anti-inflammatories. Take OTC Tylenol as needed for pain relief. Use the Lidoderm patches as directed. Use ice packs for pain relief. Consider using a neoprene sleeve for support.

## 2018-12-16 DIAGNOSIS — O09521 Supervision of elderly multigravida, first trimester: Secondary | ICD-10-CM | POA: Diagnosis not present

## 2018-12-16 DIAGNOSIS — O30041 Twin pregnancy, dichorionic/diamniotic, first trimester: Secondary | ICD-10-CM | POA: Diagnosis not present

## 2018-12-16 DIAGNOSIS — Z315 Encounter for genetic counseling: Secondary | ICD-10-CM | POA: Diagnosis not present

## 2018-12-19 DIAGNOSIS — Q615 Medullary cystic kidney: Secondary | ICD-10-CM | POA: Diagnosis not present

## 2018-12-19 DIAGNOSIS — O30041 Twin pregnancy, dichorionic/diamniotic, first trimester: Secondary | ICD-10-CM | POA: Diagnosis not present

## 2018-12-19 DIAGNOSIS — Z905 Acquired absence of kidney: Secondary | ICD-10-CM | POA: Diagnosis not present

## 2018-12-20 DIAGNOSIS — O3680X0 Pregnancy with inconclusive fetal viability, not applicable or unspecified: Secondary | ICD-10-CM | POA: Diagnosis not present

## 2018-12-20 DIAGNOSIS — O30041 Twin pregnancy, dichorionic/diamniotic, first trimester: Secondary | ICD-10-CM | POA: Diagnosis not present

## 2019-01-19 ENCOUNTER — Encounter: Payer: Self-pay | Admitting: Emergency Medicine

## 2019-01-19 ENCOUNTER — Emergency Department: Payer: Medicaid Other

## 2019-01-19 ENCOUNTER — Emergency Department
Admission: EM | Admit: 2019-01-19 | Discharge: 2019-01-19 | Disposition: A | Discharge status: Home / Self Care | Payer: Medicaid Other | Attending: Emergency Medicine | Admitting: Emergency Medicine

## 2019-01-19 ENCOUNTER — Ambulatory Visit
Admission: RE | Admit: 2019-01-19 | Discharge: 2019-01-19 | Disposition: A | Discharge status: Home / Self Care | Payer: Medicaid Other | Source: Ambulatory Visit | Attending: Emergency Medicine | Admitting: Emergency Medicine

## 2019-01-19 ENCOUNTER — Other Ambulatory Visit: Payer: Self-pay

## 2019-01-19 DIAGNOSIS — O209 Hemorrhage in early pregnancy, unspecified: Secondary | ICD-10-CM | POA: Insufficient documentation

## 2019-01-19 DIAGNOSIS — N939 Abnormal uterine and vaginal bleeding, unspecified: Secondary | ICD-10-CM

## 2019-01-19 DIAGNOSIS — J029 Acute pharyngitis, unspecified: Secondary | ICD-10-CM

## 2019-01-19 DIAGNOSIS — Z331 Pregnant state, incidental: Secondary | ICD-10-CM | POA: Diagnosis not present

## 2019-01-19 DIAGNOSIS — O26852 Spotting complicating pregnancy, second trimester: Secondary | ICD-10-CM | POA: Diagnosis not present

## 2019-01-19 DIAGNOSIS — Z3492 Encounter for supervision of normal pregnancy, unspecified, second trimester: Secondary | ICD-10-CM

## 2019-01-19 DIAGNOSIS — K122 Cellulitis and abscess of mouth: Secondary | ICD-10-CM | POA: Diagnosis not present

## 2019-01-19 DIAGNOSIS — O30002 Twin pregnancy, unspecified number of placenta and unspecified number of amniotic sacs, second trimester: Secondary | ICD-10-CM | POA: Insufficient documentation

## 2019-01-19 DIAGNOSIS — Z3A15 15 weeks gestation of pregnancy: Secondary | ICD-10-CM | POA: Diagnosis not present

## 2019-01-19 DIAGNOSIS — O98512 Other viral diseases complicating pregnancy, second trimester: Secondary | ICD-10-CM | POA: Diagnosis not present

## 2019-01-19 DIAGNOSIS — F17210 Nicotine dependence, cigarettes, uncomplicated: Secondary | ICD-10-CM | POA: Diagnosis not present

## 2019-01-19 DIAGNOSIS — O99512 Diseases of the respiratory system complicating pregnancy, second trimester: Secondary | ICD-10-CM | POA: Diagnosis not present

## 2019-01-19 DIAGNOSIS — Z79899 Other long term (current) drug therapy: Secondary | ICD-10-CM | POA: Insufficient documentation

## 2019-01-19 LAB — CBC
HCT: 30.3 % — ABNORMAL LOW (ref 36.0–46.0)
Hemoglobin: 10.2 g/dL — ABNORMAL LOW (ref 12.0–15.0)
MCH: 31.8 pg (ref 26.0–34.0)
MCHC: 33.7 g/dL (ref 30.0–36.0)
MCV: 94.4 fL (ref 80.0–100.0)
Platelets: 248 10*3/uL (ref 150–400)
RBC: 3.21 MIL/uL — ABNORMAL LOW (ref 3.87–5.11)
RDW: 13.4 % (ref 11.5–15.5)
WBC: 10.8 10*3/uL — ABNORMAL HIGH (ref 4.0–10.5)
nRBC: 0 % (ref 0.0–0.2)

## 2019-01-19 LAB — BASIC METABOLIC PANEL
Anion gap: 4 — ABNORMAL LOW (ref 5–15)
BUN: 8 mg/dL (ref 6–20)
CO2: 25 mmol/L (ref 22–32)
Calcium: 8.3 mg/dL — ABNORMAL LOW (ref 8.9–10.3)
Chloride: 104 mmol/L (ref 98–111)
Creatinine, Ser: 0.49 mg/dL (ref 0.44–1.00)
GFR calc Af Amer: >60 mL/min (ref >60)
GFR calc non Af Amer: >60 mL/min (ref >60)
Glucose, Bld: 87 mg/dL (ref 70–99)
Potassium: 3.3 mmol/L — ABNORMAL LOW (ref 3.5–5.1)
Sodium: 133 mmol/L — ABNORMAL LOW (ref 135–145)

## 2019-01-19 LAB — GROUP A STREP BY PCR: Group A Strep by PCR: NOT DETECTED

## 2019-01-19 MED ORDER — SODIUM CHLORIDE 0.9 % IV BOLUS
1000.0000 mL | Freq: Once | INTRAVENOUS | Status: AC
  Administered 2019-01-19: 1000 mL via INTRAVENOUS

## 2019-01-19 MED ORDER — DEXAMETHASONE 10 MG/ML FOR PEDIATRIC ORAL USE
INTRAMUSCULAR | Status: AC
  Administered 2019-01-19: 10 mg via ORAL
  Filled 2019-01-19: qty 1

## 2019-01-19 MED ORDER — DEXAMETHASONE 1 MG/ML PO CONC
10.0000 mg | Freq: Once | ORAL | Status: DC
  Filled 2019-01-19: qty 10

## 2019-01-19 MED ORDER — AZITHROMYCIN 500 MG PO TABS
500.0000 mg | ORAL_TABLET | Freq: Once | ORAL | Status: AC
  Administered 2019-01-19: 22:00:00 500 mg via ORAL
  Filled 2019-01-19: qty 1

## 2019-01-19 MED ORDER — AZITHROMYCIN 250 MG PO TABS: ORAL_TABLET | ORAL | 0 refills | Status: DC

## 2019-01-19 MED ORDER — POTASSIUM CHLORIDE CRYS ER 20 MEQ PO TBCR
40.0000 meq | EXTENDED_RELEASE_TABLET | Freq: Once | ORAL | Status: AC
  Administered 2019-01-19: 40 meq via ORAL
  Filled 2019-01-19: qty 2

## 2019-01-19 MED ORDER — DEXAMETHASONE 10 MG/ML FOR PEDIATRIC ORAL USE
10.0000 mg | Freq: Once | INTRAMUSCULAR | Status: AC
  Administered 2019-01-19: 22:00:00 10 mg via ORAL

## 2019-01-19 MED ORDER — LIDOCAINE VISCOUS HCL 2 % MT SOLN: 10.0000 mL | OROMUCOSAL | 0 refills | Status: DC | PRN

## 2019-01-19 MED ORDER — ACETAMINOPHEN 325 MG PO TABS
650.0000 mg | ORAL_TABLET | Freq: Once | ORAL | Status: AC
  Administered 2019-01-19: 650 mg via ORAL
  Filled 2019-01-19: qty 2

## 2019-01-19 NOTE — Discharge Instructions (Addendum)
Please follow-up with your OB on Wednesday.  Return to the emergency department for any worsening of symptoms, abdominal cramping, vaginal bleeding.

## 2019-01-19 NOTE — ED Triage Notes (Signed)
Pt via pov from home with sore throat, diarrhea (x6), emesis (x3) beginning yesterday. She states it hurts to talk or swallow. Pt alert & oriented, with nad noted.

## 2019-01-19 NOTE — ED Provider Notes (Signed)
[facility] Emergency Department Provider Note  ____________________________________________  Time seen: Approximately 8:30 PM  I have reviewed the triage vital signs and the nursing notes.   HISTORY  Chief Complaint Sore Throat    HPI Kim Kidd is a 33 y.o. female that is [id number] at 5665w3d that presents to emergency department for evaluation of sore throat, vomiting, diarrhea for 2 days.  Patient states that her uvula feels large and is making her gag, which is causing her to vomit.  She had several episodes of loose stools today.  Patient states she had some very light minimal vaginal spotting yesterday, called OB and was told that this is normal.  She has not had any vaginal spotting or bleeding today.  No abdominal cramping.  She has an appointment with OB on Wednesday.  No fevers, nasal congestion, cough, shortness of breath, chest pain.   Past Medical History:  Diagnosis Date  . History of blood transfusion   . History of preterm labor   . Liver mass   . Ovarian cyst     Patient Active Problem List   Diagnosis Date Noted  . Postoperative state 05/02/2017  . Uterine contractions during pregnancy 05/01/2017  . Premature uterine contractions 04/13/2017  . False labor after 37 weeks of gestation without delivery 04/13/2017  . Abdominal pain 03/31/2017  . Insufficient prenatal care 03/07/2016  . 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 Postpartum care following vaginal delivery 03/07/2016    Past Surgical History:  Procedure Laterality Date  . APPENDECTOMY    . BLADDER REPAIR N/A 05/02/2017   Procedure: BLADDER REPAIR;  Surgeon: Schermerhorn, Ihor Austinhomas J, MD;  Location: [facility] ORS;  Service: Obstetrics;  Laterality: N/A;  . CESAREAN SECTION N/A 05/02/2017   Procedure: CESAREAN SECTION;  Surgeon: Feliberto GottronSchermerhorn, Ihor Austinhomas J, MD;  Location: [facility] ORS;  Service: Obstetrics;  Laterality: N/A;  . CHOLECYSTECTOMY    . NEPHRECTOMY Left     Prior to Admission  medications   Medication Sig Start Date End Date Taking? Authorizing Provider  azithromycin (ZITHROMAX Z-PAK) 250 MG tablet Take 2 tablets (500 mg) on  Day 1,  followed by 1 tablet (250 mg) once daily on Days 2 through 5. 01/19/19   Enid DerryWagner, Natelie Ostrosky, PA-C  dicyclomine (BENTYL) 10 MG capsule Take 1 capsule (10 mg total) by mouth 4 (four) times daily for 14 days. 07/02/18 07/16/18  Cuthriell, Delorise RoyalsJonathan D, PA-C  ibuprofen (ADVIL,MOTRIN) 600 MG tablet Take 1 tablet (600 mg total) by mouth every 6 (six) hours. Patient not taking: Reported on 07/02/2018 11/26/17   Joni ReiningSmith, Ronald K, PA-C  lidocaine (LIDODERM) 5 % Place 1 patch onto the skin every 12 (twelve) hours. Remove & Discard patch within 12 hours or as directed by MD 12/15/18 12/15/19  Menshew, Charlesetta IvoryJenise V Bacon, PA-C  lidocaine (XYLOCAINE) 2 % solution Use as directed 10 mLs in the mouth or throat as needed. Swish and spit 01/19/19   Enid DerryWagner, Shakira Los, PA-C  metroNIDAZOLE (FLAGYL) 500 MG tablet Take 1 tablet (500 mg total) by mouth 2 (two) times daily. 07/02/18   Cuthriell, Delorise RoyalsJonathan D, PA-C  predniSONE (STERAPRED UNI-PAK 21 TAB) 10 MG (21) TBPK tablet 12 day double strength sterapred taper Patient not taking: Reported on 07/02/2018 12/23/17   Myrna BlazerSchaevitz, David Matthew, MD  traMADol (ULTRAM) 50 MG tablet Take 1 tablet (50 mg total) by mouth every 6 (six) hours as needed. Patient not taking: Reported on 07/02/2018 04/15/18 04/15/19  Jene EveryKinner, Robert, MD  triamcinolone ointment (KENALOG) 0.1 % Apply 1 application  topically 2 (two) times daily. Patient not taking: Reported on 07/02/2018 09/07/17   Menshew, Charlesetta Ivory, PA-C    Allergies Penicillins  Family History  Problem Relation Age of Onset  . Hypertension Maternal Grandmother   . Cancer Paternal Grandmother   . Cancer Paternal Grandfather     Social History Social History   Tobacco Use  . Smoking status: Current Some Day Smoker    Packs/day: 0.50    Years: 15.00    Pack years: 7.50    Types: Cigarettes  .  Smokeless tobacco: Never Used  Substance Use Topics  . Alcohol use: No  . Drug use: No     Review of Systems  Constitutional: No fever/chills ENT: No upper respiratory complaints. Positive for sore throat. Cardiovascular: No chest pain. Respiratory: No cough. No SOB. Gastrointestinal: No abdominal pain.  No nausea, no vomiting.  Musculoskeletal: Negative for musculoskeletal pain. Skin: Negative for rash, abrasions, lacerations, ecchymosis. Neurological: Negative for headaches, numbness or tingling   ____________________________________________   PHYSICAL EXAM:  VITAL SIGNS: ED Triage Vitals  Enc Vitals Group     BP 01/19/19 1713 139/75     Pulse Rate 01/19/19 1713 95     Resp 01/19/19 1713 16     Temp 01/19/19 1713 99.3 F (37.4 C)     Temp Source 01/19/19 1713 Oral     SpO2 01/19/19 1713 99 %     Weight 01/19/19 1728 150 lb (68 kg)     Height 01/19/19 1728  (1.702 m)     Head Circumference --      Peak Flow --      Pain Score 01/19/19 1728 8     Pain Loc --      Pain Edu? --      Excl. in GC? --      Constitutional: Alert and oriented. Well appearing and in no acute distress. Eyes: Conjunctivae are normal. PERRL. EOMI. Head: Atraumatic. ENT:      Ears: Tympanic membranes are pearly.      Nose: No congestion/rhinnorhea.      Mouth/Throat: Mucous membranes are moist.  Oropharynx erythematous.  Tonsils not enlarged.  Uvula is midline and mildly enlarged. Neck: No stridor.  Cardiovascular: Normal rate, regular rhythm.  Good peripheral circulation. Respiratory: Normal respiratory effort without tachypnea or retractions. Lungs CTAB. Good air entry to the bases with no decreased or absent breath sounds. Gastrointestinal: Bowel sounds 4 quadrants. Soft and nontender to palpation. No guarding or rigidity. No palpable masses. No distention.  Musculoskeletal: Full range of motion to all extremities. No gross deformities appreciated. Neurologic:  Normal speech and  language. No gross focal neurologic deficits are appreciated.  Skin:  Skin is warm, dry and intact. No rash noted. Psychiatric: Mood and affect are normal. Speech and behavior are normal. Patient exhibits appropriate insight and judgement.   ____________________________________________   LABS (all labs ordered are listed, but only abnormal results are displayed)  Labs Reviewed  CBC - Abnormal; Notable for the following components:      Result Value   WBC 10.8 (*)    RBC 3.21 (*)    Hemoglobin 10.2 (*)    HCT 30.3 (*)    All other components within normal limits  BASIC METABOLIC PANEL - Abnormal; Notable for the following components:   Sodium 133 (*)    Potassium 3.3 (*)    Calcium 8.3 (*)    Anion gap 4 (*)    All other components within  normal limits  GROUP A STREP BY PCR  SARS CORONAVIRUS 2 (TAT 6-24 HRS)   ____________________________________________  EKG   ____________________________________________  RADIOLOGY Robinette Haines, personally viewed and evaluated these images (plain radiographs) as part of my medical decision making, as well as reviewing the written report by the radiologist.  US Ob Limited  Result Date: 01/19/2019 CLINICAL DATA:  Vaginal spotting yesterday. Not feeling well. Patient pregnant twins. Patient 15 weeks and 3 days pregnant based on her last menstrual period. EXAM: LIMITED OBSTETRIC ULTRASOUND FINDINGS: Number of Fetuses:  2 Separating Membrane: Visualized TWIN 1 Heart Rate:  141 bpm Movement: Yes Presentation: Breech Placental Location: Anterior Previa: No Amniotic Fluid (Subjective):  Normal BPD:  3.16cm 15w 6d TWIN 2 Heart Rate:  155 bpm Movement: Yes Presentation: Breech Placental Location: Anterior Previa: No Amniotic Fluid (Subjective): Within normal limits. BPD:  3.02cm 15w 4d MATERNAL FINDINGS: Cervix:  Appears closed. Uterus/Adnexae: No abnormality visualized. IMPRESSION: 1. Dichorionic, diamniotic twin pregnancy with live intrauterine  pregnancies with symmetric growth. Fetal sizes consistent with the expected size based on the last menstrual period, twin 1, 15 weeks and 6 days and twin 2, 15 weeks and 4 days. 2. No evidence of a complication. No placental previa. Normal amniotic fluid. This exam is performed on an emergent basis and does not comprehensively evaluate fetal size, dating, or anatomy; follow-up complete OB US should be considered if further fetal assessment is warranted Electronically Signed   By: Lajean Manes M.D.   On: 01/19/2019 20:17    ____________________________________________    PROCEDURES  Procedure(s) performed:    Procedures    Medications  sodium chloride 0.9 % bolus 1,000 mL (0 mLs Intravenous Stopped 01/19/19 2143)  acetaminophen (TYLENOL) tablet 650 mg (650 mg Oral Given 01/19/19 1848)  potassium chloride SA (KLOR-CON) CR tablet 40 mEq (40 mEq Oral Given 01/19/19 2134)  azithromycin (ZITHROMAX) tablet 500 mg (500 mg Oral Given 01/19/19 2134)  dexamethasone (DECADRON) 10 MG/ML injection for Pediatric ORAL use 10 mg (10 mg Oral Given 01/19/19 2133)     ____________________________________________   INITIAL IMPRESSION / ASSESSMENT AND PLAN / ED COURSE  Pertinent labs & imaging results that were available during my care of the patient were reviewed by me and considered in my medical decision making (see chart for details).  Review of the [place] CSRS was performed in accordance of the [place] prior to dispensing any controlled drugs.   Patient's diagnosis is consistent with uvulitis.  Vital signs and exam are reassuring.  Patient was given a single dose of Decadron for uvula swelling.  Risks and benefits of Decadron in pregnancy were discussed with patient and she is in agreement with medication.  Risk of vomiting and not eating for 3 days we agree outweighs the risk of 1 dose of Decadron.  Azithromycin was given to cover for infection.  Tylenol was given for her headache.  Patient did have a small  amount of vaginal spotting yesterday, which she discussed with OB and was told that it was normal.  Ultrasound here shows 2 live intrauterine pregnancy without complications.  Patient will be discharged home with prescriptions for azithromycin and viscous lidocaine. Patient is to follow up with OB as directed.  Patient has an appointment on Wednesday.  Patient is given ED precautions to return to the ED for any worsening or new symptoms.    Karnisha Bevis was evaluated in Emergency Department on 01/19/2019 for the symptoms described in the history of present illness. She  was evaluated in the context of the global COVID-19 pandemic, which necessitated consideration that the patient might be at risk for infection with the SARS-CoV-2 virus that causes COVID-19. Institutional protocols and algorithms that pertain to the evaluation of patients at risk for COVID-19 are in a state of rapid change based on information released by regulatory bodies including the CDC and federal and state organizations. These policies and algorithms were followed during the patient's care in the ED.  ____________________________________________  FINAL CLINICAL IMPRESSION(S) / ED DIAGNOSES  Final diagnoses:  Pharyngitis, unspecified etiology  Uvulitis  Second trimester pregnancy      NEW MEDICATIONS STARTED DURING THIS VISIT:  ED Discharge Orders         Ordered    azithromycin (ZITHROMAX Z-PAK) 250 MG tablet     01/19/19 2054    lidocaine (XYLOCAINE) 2 % solution  As needed     01/19/19 2054              This chart was dictated using voice recognition software/Dragon. Despite best efforts to proofread, errors can occur which can change the meaning. Any change was purely unintentional.    Enid Derry, PA-C 01/19/19 2226    Concha Se, MD 01/20/19 1032

## 2019-01-22 ENCOUNTER — Other Ambulatory Visit: Payer: Self-pay | Admitting: Emergency Medicine

## 2019-01-22 DIAGNOSIS — Z3A15 15 weeks gestation of pregnancy: Secondary | ICD-10-CM | POA: Diagnosis not present

## 2019-01-22 DIAGNOSIS — O30002 Twin pregnancy, unspecified number of placenta and unspecified number of amniotic sacs, second trimester: Secondary | ICD-10-CM

## 2019-01-22 DIAGNOSIS — O209 Hemorrhage in early pregnancy, unspecified: Secondary | ICD-10-CM

## 2019-01-22 DIAGNOSIS — O26852 Spotting complicating pregnancy, second trimester: Secondary | ICD-10-CM | POA: Diagnosis not present

## 2019-02-04 ENCOUNTER — Other Ambulatory Visit: Payer: Self-pay

## 2019-02-04 ENCOUNTER — Emergency Department
Admission: EM | Admit: 2019-02-04 | Discharge: 2019-02-04 | Disposition: A | Discharge status: Home / Self Care | Payer: Medicaid Other | Attending: Emergency Medicine | Admitting: Emergency Medicine

## 2019-02-04 ENCOUNTER — Encounter: Payer: Self-pay | Admitting: Emergency Medicine

## 2019-02-04 ENCOUNTER — Emergency Department: Payer: Medicaid Other

## 2019-02-04 DIAGNOSIS — O209 Hemorrhage in early pregnancy, unspecified: Secondary | ICD-10-CM | POA: Diagnosis not present

## 2019-02-04 DIAGNOSIS — R1084 Generalized abdominal pain: Secondary | ICD-10-CM | POA: Diagnosis not present

## 2019-02-04 DIAGNOSIS — O26892 Other specified pregnancy related conditions, second trimester: Secondary | ICD-10-CM | POA: Diagnosis not present

## 2019-02-04 DIAGNOSIS — Z3A18 18 weeks gestation of pregnancy: Secondary | ICD-10-CM | POA: Diagnosis not present

## 2019-02-04 DIAGNOSIS — F17210 Nicotine dependence, cigarettes, uncomplicated: Secondary | ICD-10-CM | POA: Insufficient documentation

## 2019-02-04 DIAGNOSIS — Z79899 Other long term (current) drug therapy: Secondary | ICD-10-CM | POA: Insufficient documentation

## 2019-02-04 DIAGNOSIS — Z3A17 17 weeks gestation of pregnancy: Secondary | ICD-10-CM | POA: Diagnosis not present

## 2019-02-04 DIAGNOSIS — O4690 Antepartum hemorrhage, unspecified, unspecified trimester: Secondary | ICD-10-CM

## 2019-02-04 DIAGNOSIS — Z3492 Encounter for supervision of normal pregnancy, unspecified, second trimester: Secondary | ICD-10-CM

## 2019-02-04 DIAGNOSIS — N939 Abnormal uterine and vaginal bleeding, unspecified: Secondary | ICD-10-CM

## 2019-02-04 DIAGNOSIS — O200 Threatened abortion: Secondary | ICD-10-CM | POA: Diagnosis not present

## 2019-02-04 LAB — URINALYSIS, COMPLETE (UACMP) WITH MICROSCOPIC
Bilirubin Urine: NEGATIVE
Glucose, UA: NEGATIVE mg/dL
Ketones, ur: NEGATIVE mg/dL
Leukocytes,Ua: NEGATIVE
Nitrite: NEGATIVE
Protein, ur: NEGATIVE mg/dL
Specific Gravity, Urine: 1.009 (ref 1.005–1.030)
pH: 7 (ref 5.0–8.0)

## 2019-02-04 LAB — CBC
HCT: 34.8 % — ABNORMAL LOW (ref 36.0–46.0)
Hemoglobin: 11.6 g/dL — ABNORMAL LOW (ref 12.0–15.0)
MCH: 31.5 pg (ref 26.0–34.0)
MCHC: 33.3 g/dL (ref 30.0–36.0)
MCV: 94.6 fL (ref 80.0–100.0)
Platelets: 320 10*3/uL (ref 150–400)
RBC: 3.68 MIL/uL — ABNORMAL LOW (ref 3.87–5.11)
RDW: 13.1 % (ref 11.5–15.5)
WBC: 11.9 10*3/uL — ABNORMAL HIGH (ref 4.0–10.5)
nRBC: 0 % (ref 0.0–0.2)

## 2019-02-04 LAB — HCG, QUANTITATIVE, PREGNANCY: hCG, Beta Chain, Quant, S: 116360 m[IU]/mL — ABNORMAL HIGH (ref <5)

## 2019-02-04 LAB — COMPREHENSIVE METABOLIC PANEL
ALT: 13 U/L (ref 0–44)
AST: 15 U/L (ref 15–41)
Albumin: 3.3 g/dL — ABNORMAL LOW (ref 3.5–5.0)
Alkaline Phosphatase: 44 U/L (ref 38–126)
Anion gap: 8 (ref 5–15)
BUN: 10 mg/dL (ref 6–20)
CO2: 22 mmol/L (ref 22–32)
Calcium: 8.9 mg/dL (ref 8.9–10.3)
Chloride: 106 mmol/L (ref 98–111)
Creatinine, Ser: 0.49 mg/dL (ref 0.44–1.00)
GFR calc Af Amer: >60 mL/min (ref >60)
GFR calc non Af Amer: >60 mL/min (ref >60)
Glucose, Bld: 82 mg/dL (ref 70–99)
Potassium: 3.7 mmol/L (ref 3.5–5.1)
Sodium: 136 mmol/L (ref 135–145)
Total Bilirubin: 0.5 mg/dL (ref 0.3–1.2)
Total Protein: 7.3 g/dL (ref 6.5–8.1)

## 2019-02-04 LAB — ABO/RH: ABO/RH(D): AB POS

## 2019-02-04 NOTE — ED Triage Notes (Signed)
Pt reports is [redacted] weeks pregnant with twins and is having some abd pain and vaginal bleeding.

## 2019-02-04 NOTE — ED Notes (Signed)
Pt has returned from [place] via wheelchair but able to stand and pivot into treatment bed without difficulty. NAD noted, no change in patient's status since leaving for [place]

## 2019-02-04 NOTE — ED Notes (Signed)
Pt transported to [place]

## 2019-02-04 NOTE — ED Provider Notes (Signed)
[facility] Emergency Department Provider Note  Time seen: 1:01 PM  I have reviewed the triage vital signs and the nursing notes.   HISTORY  Chief Complaint Vaginal Bleeding and Abdominal Pain   HPI Kim Kidd is a 33 y.o. female with no significant past medical history presents to the emergency department for abdominal pain and vaginal bleeding approximately [redacted] weeks pregnant with twins.  Patient states over the past 3 weeks she has been experiencing intermittent vaginal spotting described as more of a pink discharge.  Has been in discussions with her OB at [facility] who states it is normal per patient.  States she has an appointment coming up this week but thought the pain was somewhat worse today so she came to the emergency department for evaluation.  Patient is describing a sharp pain in her right flank that is been intermittent and radiating down into her right leg.  Denies any dysuria or hematuria.  Patient states she recently had a pelvic exam and STD testing performed by her OB and has not had any new sexual partners.   Denies any fever cough or shortness of breath.  Past Medical History:  Diagnosis Date  . History of blood transfusion   . History of preterm labor   . Liver mass   . Ovarian cyst     Patient Active Problem List   Diagnosis Date Noted  . Postoperative state 05/02/2017  . Uterine contractions during pregnancy 05/01/2017  . Premature uterine contractions 04/13/2017  . False labor after 37 weeks of gestation without delivery 04/13/2017  . Abdominal pain 03/31/2017  . Insufficient prenatal care 03/07/2016  . 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 Postpartum care following vaginal delivery 03/07/2016    Past Surgical History:  Procedure Laterality Date  . APPENDECTOMY    . BLADDER REPAIR N/A 05/02/2017   Procedure: BLADDER REPAIR;  Surgeon: Schermerhorn, Ihor Austin, MD;  Location: [facility] ORS;  Service: Obstetrics;  Laterality: N/A;  .  CESAREAN SECTION N/A 05/02/2017   Procedure: CESAREAN SECTION;  Surgeon: Feliberto Gottron Ihor Austin, MD;  Location: [facility] ORS;  Service: Obstetrics;  Laterality: N/A;  . CHOLECYSTECTOMY    . NEPHRECTOMY Left     Prior to Admission medications   Medication Sig Start Date End Date Taking? Authorizing Provider  azithromycin (ZITHROMAX Z-PAK) 250 MG tablet Take 2 tablets (500 mg) on  Day 1,  followed by 1 tablet (250 mg) once daily on Days 2 through 5. 01/19/19   Enid Derry, PA-C  dicyclomine (BENTYL) 10 MG capsule Take 1 capsule (10 mg total) by mouth 4 (four) times daily for 14 days. 07/02/18 07/16/18  Cuthriell, Delorise Royals, PA-C  ibuprofen (ADVIL,MOTRIN) 600 MG tablet Take 1 tablet (600 mg total) by mouth every 6 (six) hours. Patient not taking: Reported on 07/02/2018 11/26/17   Joni Reining, PA-C  lidocaine (LIDODERM) 5 % Place 1 patch onto the skin every 12 (twelve) hours. Remove & Discard patch within 12 hours or as directed by MD 12/15/18 12/15/19  Menshew, Charlesetta Ivory, PA-C  lidocaine (XYLOCAINE) 2 % solution Use as directed 10 mLs in the mouth or throat as needed. Swish and spit 01/19/19   Enid Derry, PA-C  metroNIDAZOLE (FLAGYL) 500 MG tablet Take 1 tablet (500 mg total) by mouth 2 (two) times daily. 07/02/18   Cuthriell, Delorise Royals, PA-C  predniSONE (STERAPRED UNI-PAK 21 TAB) 10 MG (21) TBPK tablet 12 day double strength sterapred taper Patient not taking: Reported on 07/02/2018 12/23/17  Schaevitz, Randall An, MD  traMADol (ULTRAM) 50 MG tablet Take 1 tablet (50 mg total) by mouth every 6 (six) hours as needed. Patient not taking: Reported on 07/02/2018 04/15/18 04/15/19  Lavonia Drafts, MD  triamcinolone ointment (KENALOG) 0.1 % Apply 1 application topically 2 (two) times daily. Patient not taking: Reported on 07/02/2018 09/07/17   Menshew, Dannielle Karvonen, PA-C    Allergies  Allergen Reactions  . Penicillins Hives    Has patient had a PCN reaction causing immediate rash,  facial/tongue/throat swelling, SOB or lightheadedness with hypotension: Unknown Has patient had a PCN reaction causing severe rash involving mucus membranes or skin necrosis: Unknown Has patient had a PCN reaction that required hospitalization: Unknown Has patient had a PCN reaction occurring within the last 10 years: Unknown If all of the above answers are "NO", then may proceed with Cephalosporin use.     Family History  Problem Relation Age of Onset  . Hypertension Maternal Grandmother   . Cancer Paternal Grandmother   . Cancer Paternal Grandfather     Social History Social History   Tobacco Use  . Smoking status: Current Some Day Smoker    Packs/day: 0.50    Years: 15.00    Pack years: 7.50    Types: Cigarettes  . Smokeless tobacco: Never Used  Substance Use Topics  . Alcohol use: No  . Drug use: No    Review of Systems Constitutional: Negative for fever. Cardiovascular: Negative for chest pain. Respiratory: Negative for shortness of breath.  Negative for cough. Gastrointestinal: Negative for abdominal pain Musculoskeletal: Negative for musculoskeletal complaints Neurological: Negative for headache All other ROS negative  ____________________________________________   PHYSICAL EXAM:  VITAL SIGNS: ED Triage Vitals  Enc Vitals Group     BP 02/04/19 1134 115/60     Pulse Rate 02/04/19 1134 83     Resp 02/04/19 1134 20     Temp 02/04/19 1134 98.9 F (37.2 C)     Temp Source 02/04/19 1134 Oral     SpO2 02/04/19 1134 100 %     Weight 02/04/19 1135 170 lb (77.1 kg)     Height 02/04/19 1135 5\' 7"  (1.702 m)     Head Circumference --      Peak Flow --      Pain Score 02/04/19 1135 7     Pain Loc --      Pain Edu? --      Excl. in [place]? --    Constitutional: Alert and oriented. Well appearing and in no distress. Eyes: Normal exam ENT      Head: Normocephalic and atraumatic.      Mouth/Throat: Mucous membranes are moist. Cardiovascular: Normal rate, regular  rhythm Respiratory: Normal respiratory effort without tachypnea nor retractions. Breath sounds are clear Gastrointestinal: Soft and nontender. No distention. Musculoskeletal: Nontender with normal range of motion in all extremities. Neurologic:  Normal speech and language. No gross focal neurologic deficits  Skin:  Skin is warm, dry and intact.  Psychiatric: Mood and affect are normal.   ____________________________________________   RADIOLOGY  Ultrasound shows live twin pregnancy 17 weeks and 6 days as well as 17 weeks and 3 days.  No acute abnormality noted.  Normal heart rates.  ____________________________________________   INITIAL IMPRESSION / ASSESSMENT AND PLAN / ED COURSE  Pertinent labs & imaging results that were available during my care of the patient were reviewed by me and considered in my medical decision making (see chart for details).   Patient  presents to the emergency department for intermittent abdominal pain intermittent vaginal spotting over the past 3 weeks.  Patient is approximate [redacted] weeks pregnant with twins.  We will obtain an ultrasound to evaluate the twins.  We will check labs and a urinalysis.  Patient agreeable to plan of care.  Patient's lab work is largely within normal limits/baseline for the patient.  Patient has a positive blood type does not require RhoGam.  Ultrasound pending at this time.  Ultrasound reassuring.  Urinalysis negative for acute infection.  We will discharge patient home with OB follow-up.  Patient agreeable to plan of care.  Kim BlanksKrystle Kidd was evaluated in Emergency Department on 02/04/2019 for the symptoms described in the history of present illness. She was evaluated in the context of the global COVID-19 pandemic, which necessitated consideration that the patient might be at risk for infection with the SARS-CoV-2 virus that causes COVID-19. Institutional protocols and algorithms that pertain to the evaluation of patients at risk  for COVID-19 are in a state of rapid change based on information released by regulatory bodies including the CDC and federal and state organizations. These policies and algorithms were followed during the patient's care in the ED.  ____________________________________________   FINAL CLINICAL IMPRESSION(S) / ED DIAGNOSES  Threatened miscarriage   Kim Kidd, Kim Eckford, MD 02/04/19 [phone]

## 2019-02-04 NOTE — ED Notes (Signed)
ED Provider at bedside. 

## 2019-02-07 DIAGNOSIS — O30042 Twin pregnancy, dichorionic/diamniotic, second trimester: Secondary | ICD-10-CM | POA: Diagnosis not present

## 2019-02-07 DIAGNOSIS — Z363 Encounter for antenatal screening for malformations: Secondary | ICD-10-CM | POA: Diagnosis not present

## 2019-02-07 DIAGNOSIS — Z6824 Body mass index (BMI) 24.0-24.9, adult: Secondary | ICD-10-CM | POA: Diagnosis not present

## 2019-02-07 DIAGNOSIS — Z3A18 18 weeks gestation of pregnancy: Secondary | ICD-10-CM | POA: Diagnosis not present

## 2019-02-07 DIAGNOSIS — O99212 Obesity complicating pregnancy, second trimester: Secondary | ICD-10-CM | POA: Diagnosis not present

## 2019-02-07 DIAGNOSIS — E668 Other obesity: Secondary | ICD-10-CM | POA: Diagnosis not present

## 2019-02-15 DIAGNOSIS — Z20828 Contact with and (suspected) exposure to other viral communicable diseases: Secondary | ICD-10-CM | POA: Diagnosis not present

## 2019-03-04 IMAGING — RF DG CYSTOGRAM 3+V
1 series · 15 of 17 positions shown · non-contrast
Comparison: None.

CLINICAL DATA: Bladder injury.  Presents for re-evaluation.

EXAM:
CYSTOGRAM
TECHNIQUE: After catheterization of the urinary bladder following sterile
technique the bladder was filled with 300 mL Polin 2 by drip
infusion. Serial spot images were obtained during bladder filling
and post draining.
FLUOROSCOPY TIME:  Fluoroscopy Time:  1.5 minute
Radiation Exposure Index (if provided by the fluoroscopic device):
37 mGy
Number of Acquired Spot Images: 0

[Series 1: fluoro_iodine_singleshot_bw · 0.17mm/px · 15 of 17 slices shown]
[im 1/17]
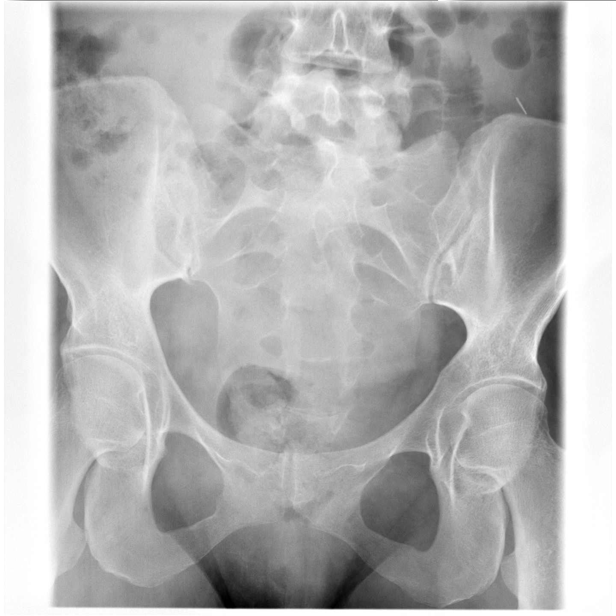
[im 2/17]
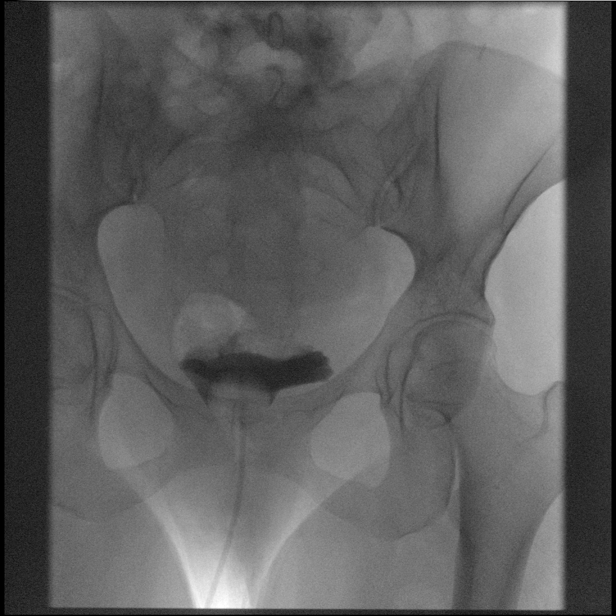
[im 3/17]
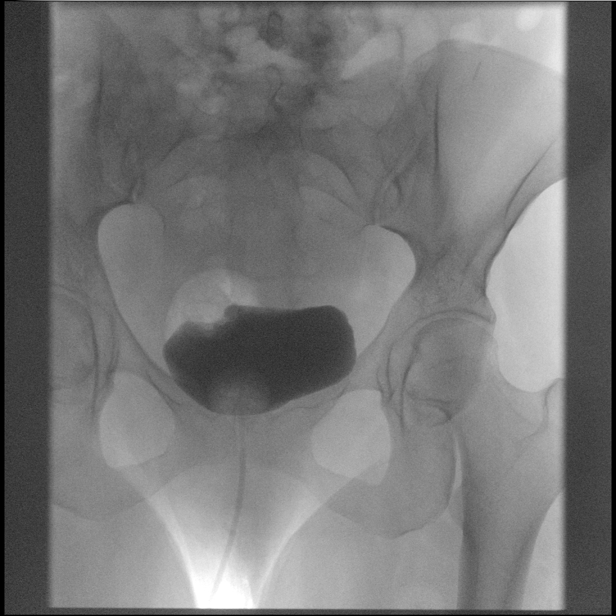
[im 4/17]
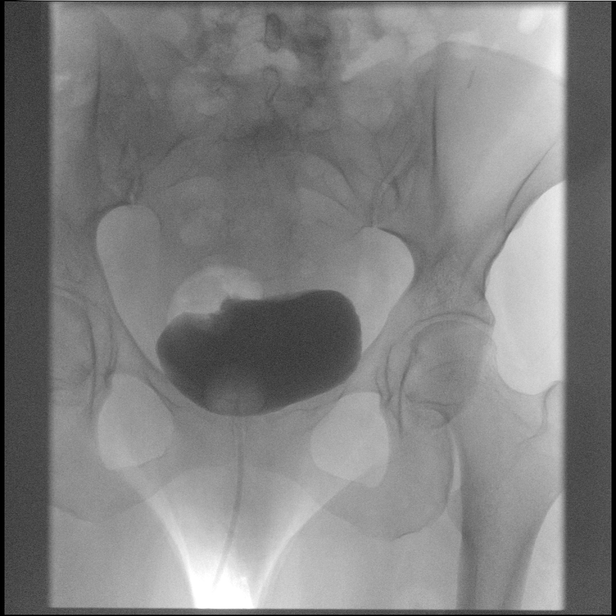
[im 6/17]
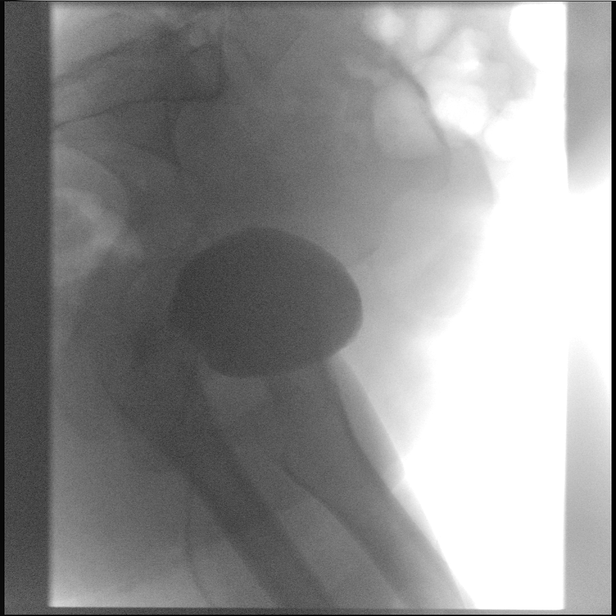
[im 7/17]
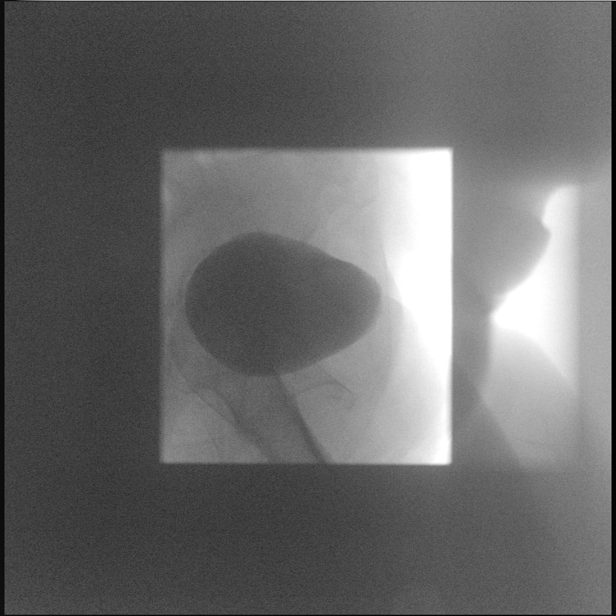
[im 8/17]
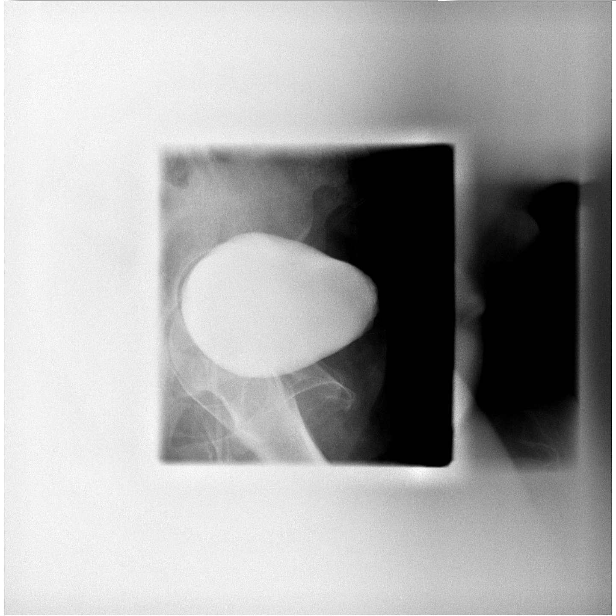
[im 9/17]
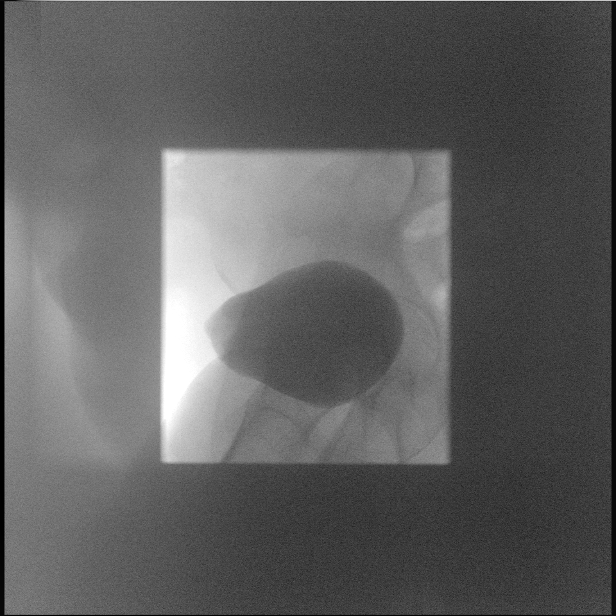
[im 10/17]
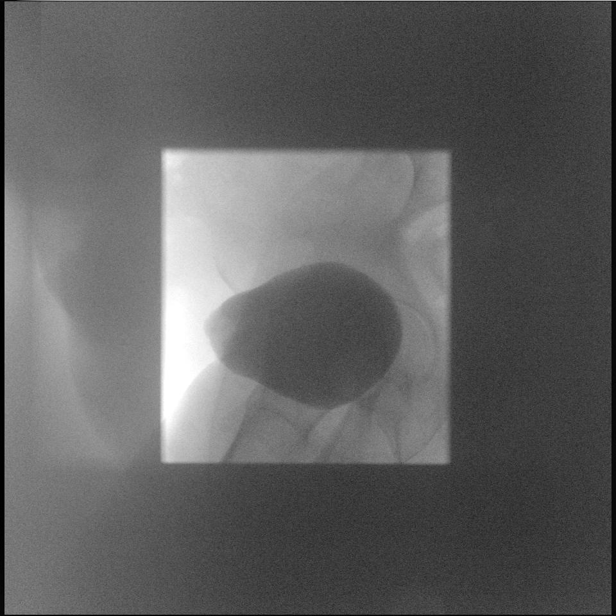
[im 11/17]
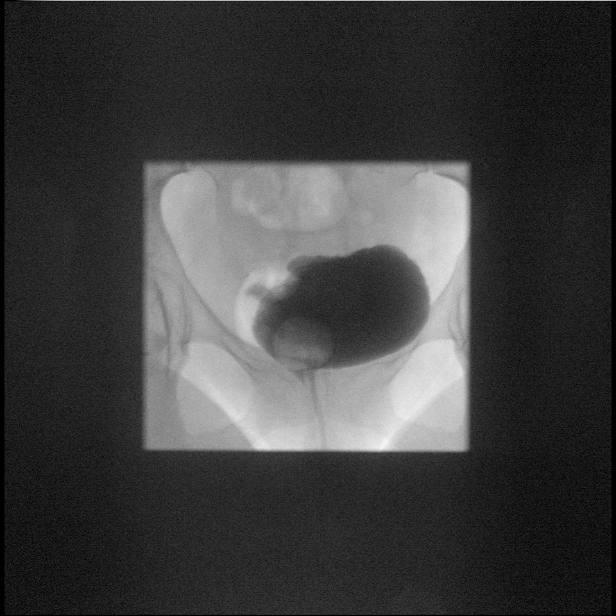
[im 12/17]
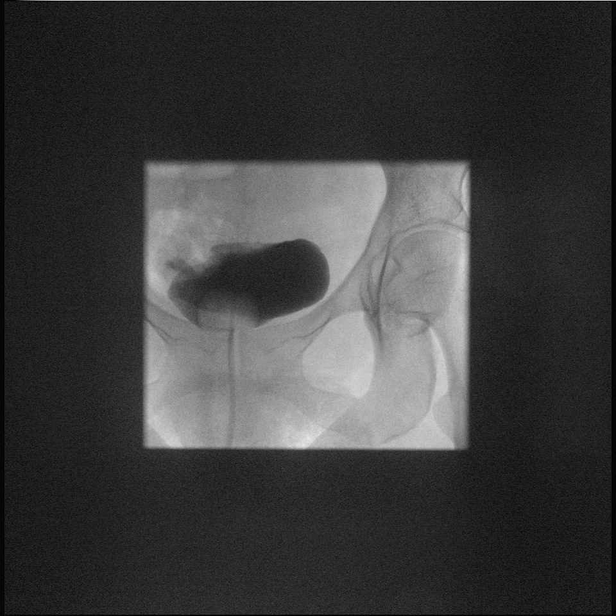
[im 14/17]
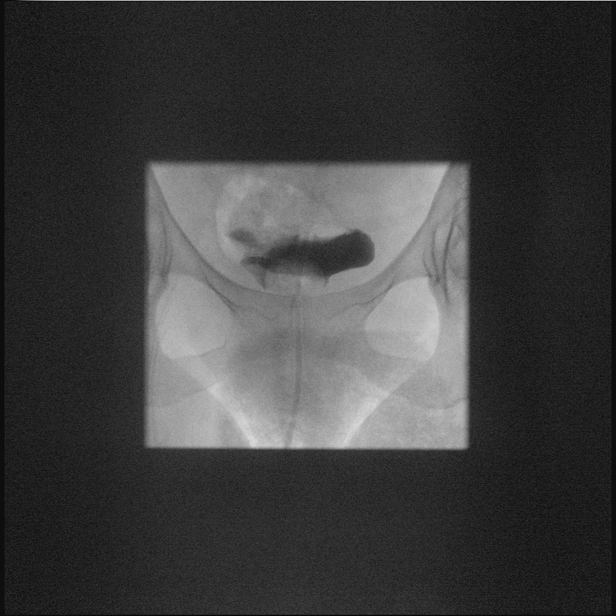
[im 15/17]
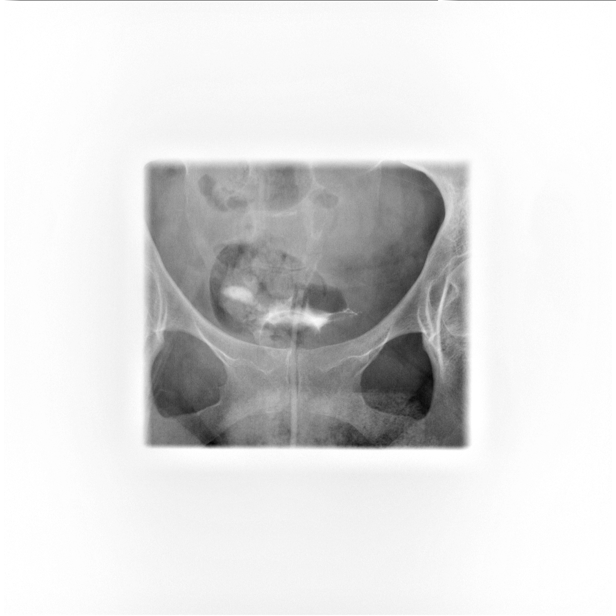
[im 16/17]
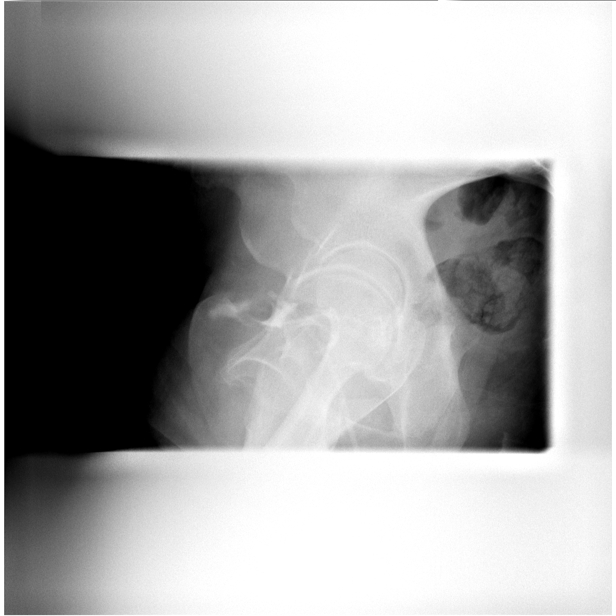
[im 17/17]
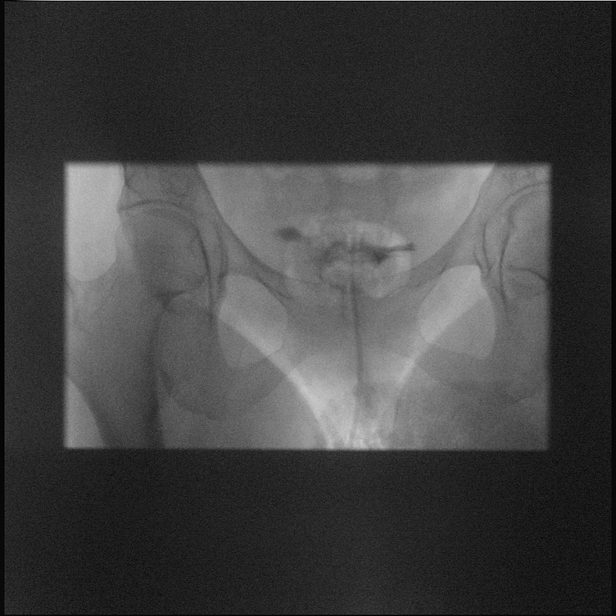

[15 of 17 positions shown; findings below may reference images not displayed]

FINDINGS: Initial scout image demonstrates no focal abnormality. No radiopaque
foreign body.. The overlying bowel gas pattern is normal. The
regional skeleton is unremarkable.

A pre-existing Foley catheter was present in the bladder and 300 ml
of Polin 2 was instilled via gravity. The bladder is normal
in capacity. No vesicoureteral reflux is identified. There are no
filling defects within the bladder. There is no extraluminal
contrast. There is no post void residual on post void images.
IMPRESSION: Normal cystogram.  No evidence of a bladder leak.

## 2019-03-07 DIAGNOSIS — Z6824 Body mass index (BMI) 24.0-24.9, adult: Secondary | ICD-10-CM | POA: Diagnosis not present

## 2019-03-07 DIAGNOSIS — O99212 Obesity complicating pregnancy, second trimester: Secondary | ICD-10-CM | POA: Diagnosis not present

## 2019-03-07 DIAGNOSIS — O09522 Supervision of elderly multigravida, second trimester: Secondary | ICD-10-CM | POA: Diagnosis not present

## 2019-03-07 DIAGNOSIS — E668 Other obesity: Secondary | ICD-10-CM | POA: Diagnosis not present

## 2019-03-07 DIAGNOSIS — O30042 Twin pregnancy, dichorionic/diamniotic, second trimester: Secondary | ICD-10-CM | POA: Diagnosis not present

## 2019-04-04 DIAGNOSIS — Z3A26 26 weeks gestation of pregnancy: Secondary | ICD-10-CM | POA: Diagnosis not present

## 2019-04-04 DIAGNOSIS — Z362 Encounter for other antenatal screening follow-up: Secondary | ICD-10-CM | POA: Diagnosis not present

## 2019-04-04 DIAGNOSIS — O30042 Twin pregnancy, dichorionic/diamniotic, second trimester: Secondary | ICD-10-CM | POA: Diagnosis not present

## 2019-04-04 DIAGNOSIS — O09522 Supervision of elderly multigravida, second trimester: Secondary | ICD-10-CM | POA: Diagnosis not present

## 2019-04-25 DIAGNOSIS — O0993 Supervision of high risk pregnancy, unspecified, third trimester: Secondary | ICD-10-CM | POA: Diagnosis not present

## 2019-04-25 DIAGNOSIS — Z23 Encounter for immunization: Secondary | ICD-10-CM | POA: Diagnosis not present

## 2019-04-25 DIAGNOSIS — O30043 Twin pregnancy, dichorionic/diamniotic, third trimester: Secondary | ICD-10-CM | POA: Diagnosis not present

## 2019-04-29 DIAGNOSIS — R7309 Other abnormal glucose: Secondary | ICD-10-CM | POA: Diagnosis not present

## 2019-04-29 DIAGNOSIS — O30043 Twin pregnancy, dichorionic/diamniotic, third trimester: Secondary | ICD-10-CM | POA: Diagnosis not present

## 2019-05-02 DIAGNOSIS — O30003 Twin pregnancy, unspecified number of placenta and unspecified number of amniotic sacs, third trimester: Secondary | ICD-10-CM | POA: Diagnosis not present

## 2019-05-02 DIAGNOSIS — O09523 Supervision of elderly multigravida, third trimester: Secondary | ICD-10-CM | POA: Diagnosis not present

## 2019-05-02 DIAGNOSIS — R7309 Other abnormal glucose: Secondary | ICD-10-CM | POA: Diagnosis not present

## 2019-05-02 DIAGNOSIS — M7989 Other specified soft tissue disorders: Secondary | ICD-10-CM | POA: Diagnosis not present

## 2019-05-02 DIAGNOSIS — M79605 Pain in left leg: Secondary | ICD-10-CM | POA: Diagnosis not present

## 2019-05-02 DIAGNOSIS — O30043 Twin pregnancy, dichorionic/diamniotic, third trimester: Secondary | ICD-10-CM | POA: Diagnosis not present

## 2019-05-02 DIAGNOSIS — Z3A30 30 weeks gestation of pregnancy: Secondary | ICD-10-CM | POA: Diagnosis not present

## 2019-05-02 DIAGNOSIS — R600 Localized edema: Secondary | ICD-10-CM | POA: Diagnosis not present

## 2019-05-06 DIAGNOSIS — O321XX1 Maternal care for breech presentation, fetus 1: Secondary | ICD-10-CM | POA: Diagnosis not present

## 2019-05-06 DIAGNOSIS — Z302 Encounter for sterilization: Secondary | ICD-10-CM | POA: Diagnosis not present

## 2019-05-06 DIAGNOSIS — Z3A30 30 weeks gestation of pregnancy: Secondary | ICD-10-CM | POA: Diagnosis not present

## 2019-05-06 DIAGNOSIS — F419 Anxiety disorder, unspecified: Secondary | ICD-10-CM | POA: Diagnosis not present

## 2019-05-06 DIAGNOSIS — O34219 Maternal care for unspecified type scar from previous cesarean delivery: Secondary | ICD-10-CM | POA: Diagnosis not present

## 2019-05-06 DIAGNOSIS — O34211 Maternal care for low transverse scar from previous cesarean delivery: Secondary | ICD-10-CM | POA: Diagnosis not present

## 2019-05-06 DIAGNOSIS — F329 Major depressive disorder, single episode, unspecified: Secondary | ICD-10-CM | POA: Diagnosis not present

## 2019-05-06 DIAGNOSIS — O09523 Supervision of elderly multigravida, third trimester: Secondary | ICD-10-CM | POA: Diagnosis not present

## 2019-05-06 DIAGNOSIS — O30043 Twin pregnancy, dichorionic/diamniotic, third trimester: Secondary | ICD-10-CM | POA: Diagnosis not present

## 2019-05-23 DIAGNOSIS — R1031 Right lower quadrant pain: Secondary | ICD-10-CM | POA: Diagnosis not present

## 2019-05-23 DIAGNOSIS — G8918 Other acute postprocedural pain: Secondary | ICD-10-CM | POA: Diagnosis not present

## 2019-05-23 DIAGNOSIS — R1033 Periumbilical pain: Secondary | ICD-10-CM | POA: Diagnosis not present

## 2019-05-23 DIAGNOSIS — F418 Other specified anxiety disorders: Secondary | ICD-10-CM | POA: Diagnosis not present

## 2019-05-23 DIAGNOSIS — M25551 Pain in right hip: Secondary | ICD-10-CM | POA: Diagnosis not present

## 2019-05-23 DIAGNOSIS — N1330 Unspecified hydronephrosis: Secondary | ICD-10-CM | POA: Diagnosis not present

## 2019-05-23 DIAGNOSIS — Z9889 Other specified postprocedural states: Secondary | ICD-10-CM | POA: Diagnosis not present

## 2019-05-23 DIAGNOSIS — R102 Pelvic and perineal pain: Secondary | ICD-10-CM | POA: Diagnosis not present

## 2019-05-24 DIAGNOSIS — G8918 Other acute postprocedural pain: Secondary | ICD-10-CM | POA: Diagnosis not present

## 2019-07-24 IMAGING — DX DG ABDOMEN 1V
2 series · 2 of 2 positions shown · non-contrast
Comparison: 05/02/2017 abdomen radiographs, 06/30/2017 CT

CLINICAL DATA: Lower abdominal pain x3 days with nausea, vomiting
and diarrhea. Cholecystectomy, appendectomy and left nephrectomy.

EXAM:
ABDOMEN - 1 VIEW

[abdomen kub (1 of 2)]
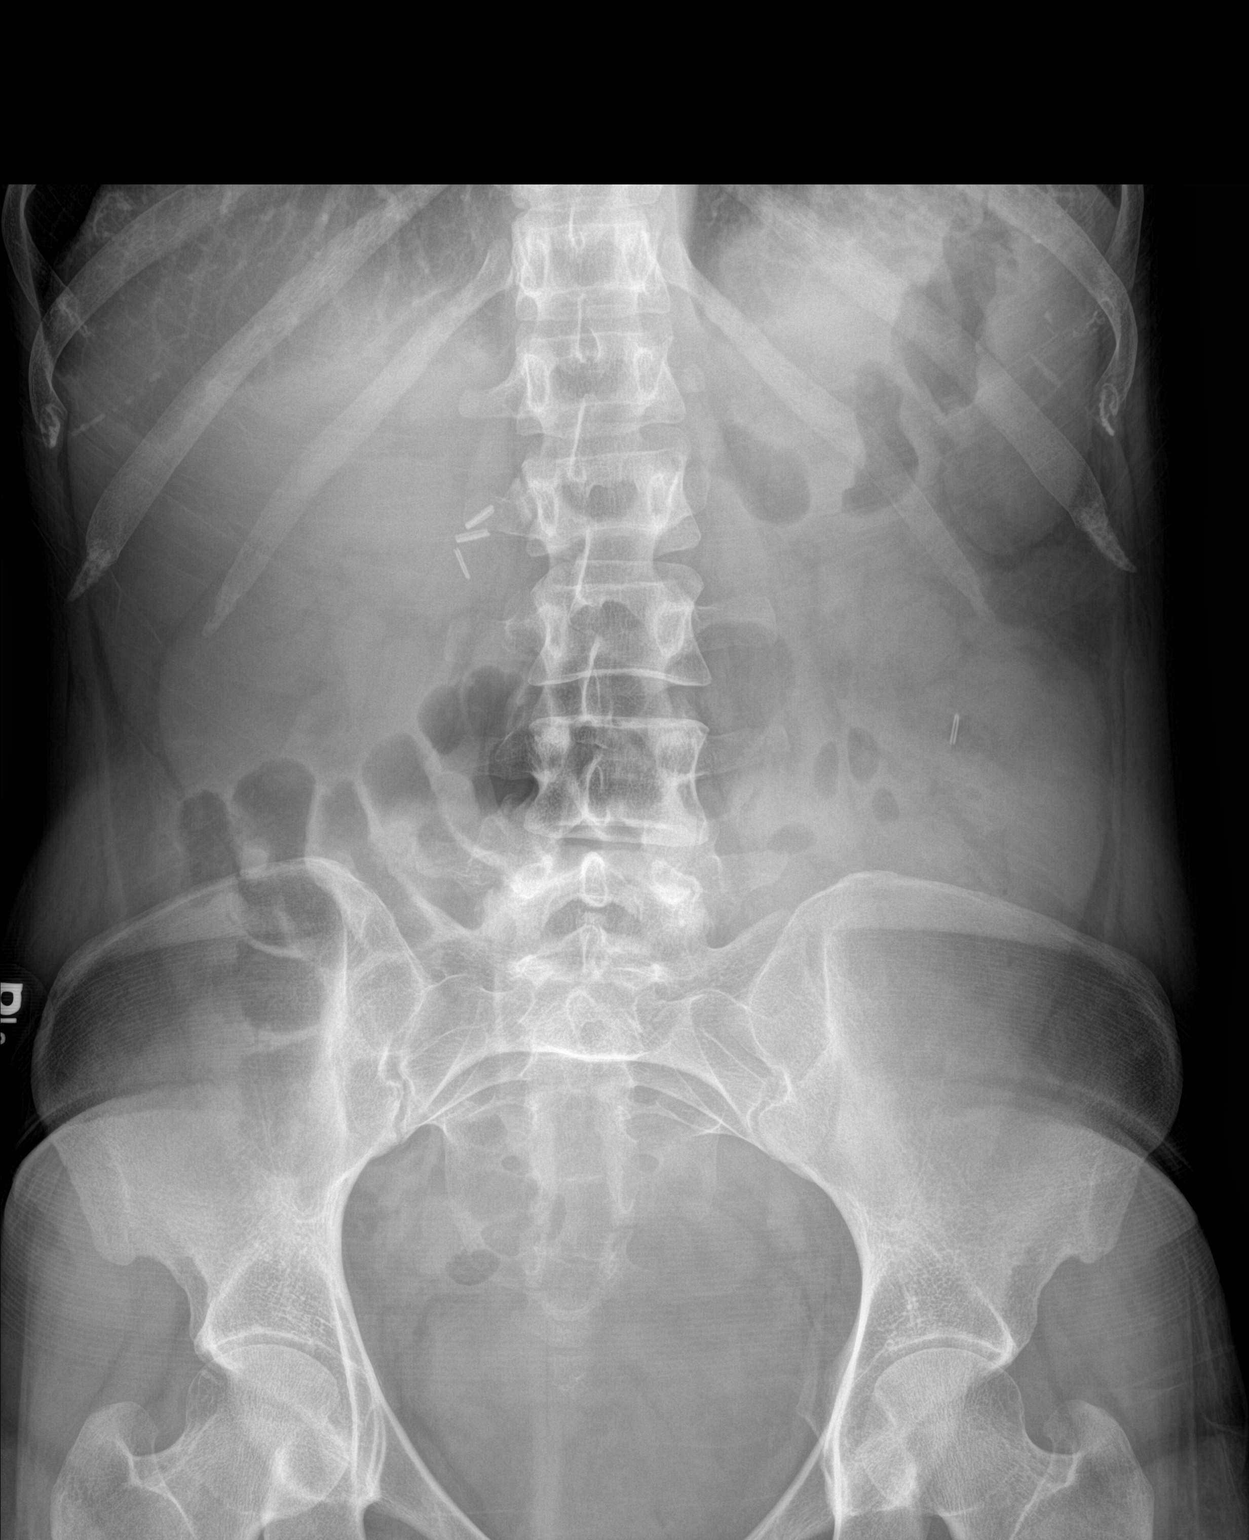

[abdomen kub (2 of 2)]
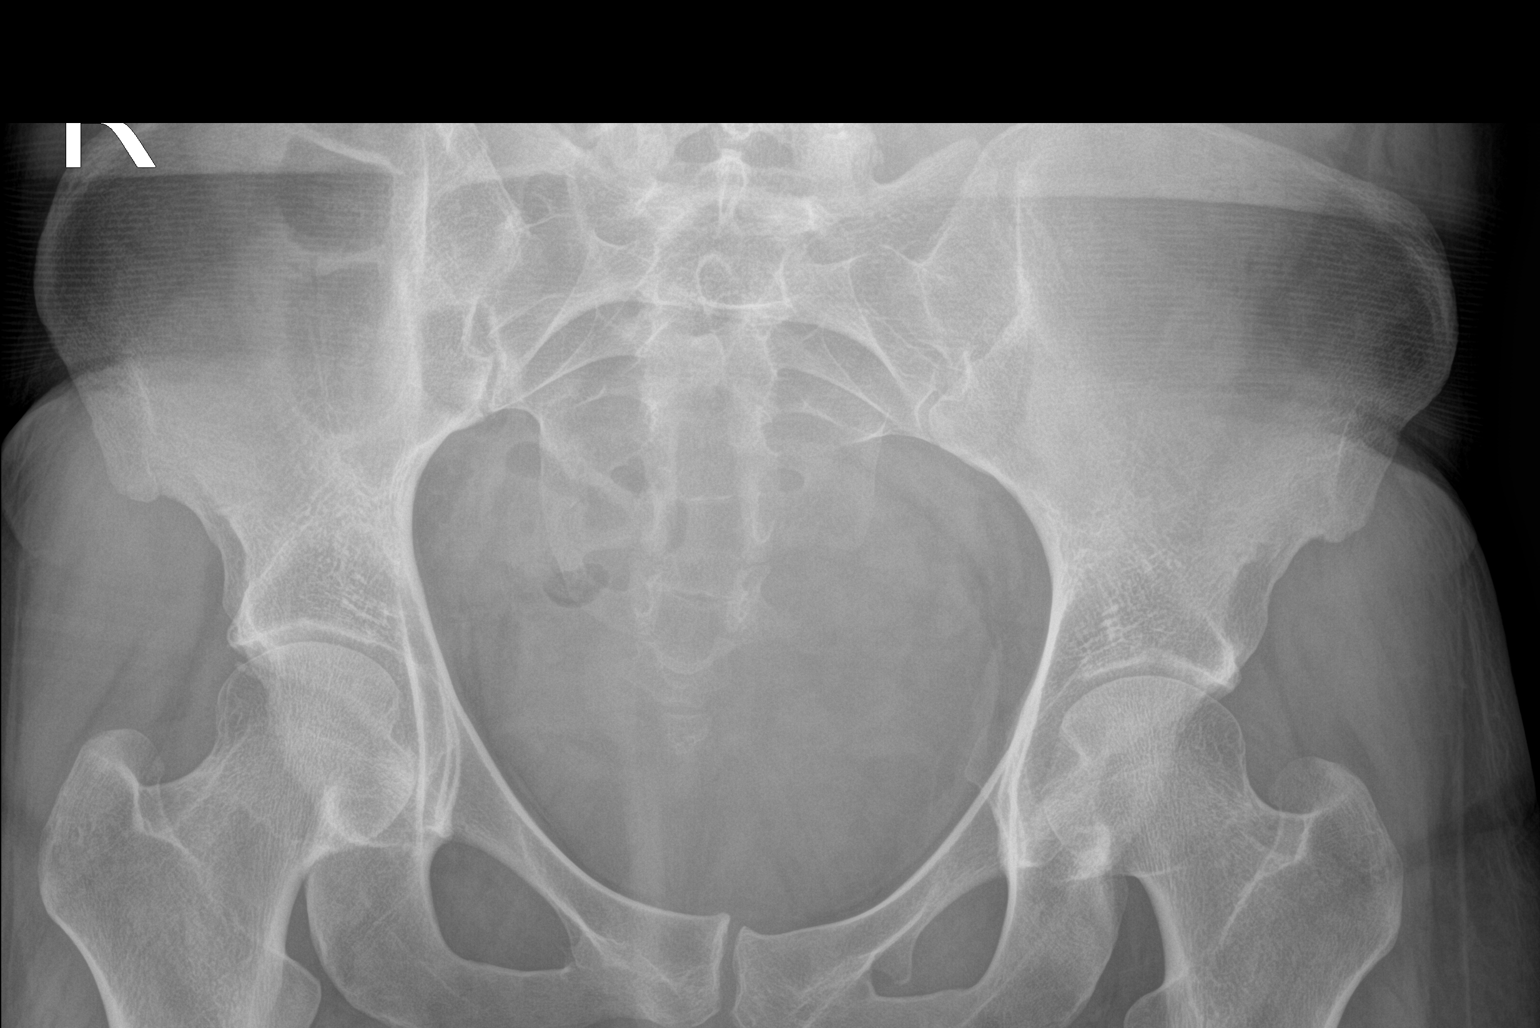

[2 of 2 positions shown; findings below may reference images not displayed]

FINDINGS: The bowel gas pattern is normal. Cholecystectomy clips are seen in
the right upper quadrant. A single clip is also seen in the left
hemiabdomen. No radio-opaque calculi or other significant
radiographic abnormality are seen.
IMPRESSION: 1. Nonobstructed, nondistended bowel gas pattern.  No free air.
2. No radiopaque calculi.
3. Cholecystectomy.

## 2019-07-24 IMAGING — CT CT ABD-PELV W/ CM
2 of 4 series · 15 of 46 positions shown, 17 images · IV contrast (APPLIED)
Comparison: 06/30/2017

CLINICAL DATA: Lower abdominal pain for 3 days. Nausea and
vomiting. Diarrhea. Suspected diverticulitis.

EXAM:
CT ABDOMEN AND PELVIS WITH CONTRAST
TECHNIQUE: Multidetector CT imaging of the abdomen and pelvis was performed
using the standard protocol following bolus administration of
intravenous contrast.
CONTRAST:  100mL PZDH8N-1DD IOPAMIDOL (PZDH8N-1DD) INJECTION 61%

[Series 2: routine abd/pel with · axial · 0.67mm/px · z∈[-114,+306]mm · 12 of 92 slices shown, 14 images]
[im 4/92  soft-tissue]
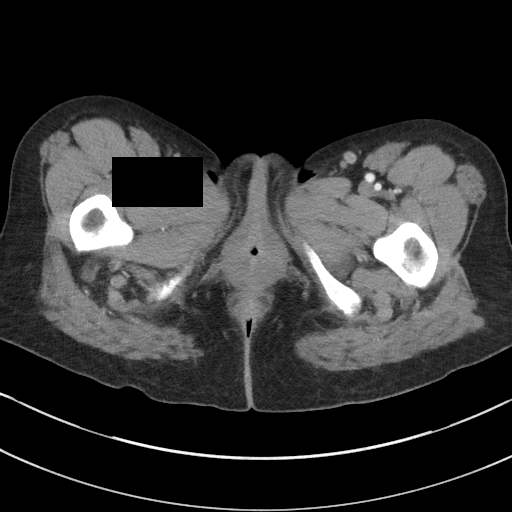
[im 4/92  bone]
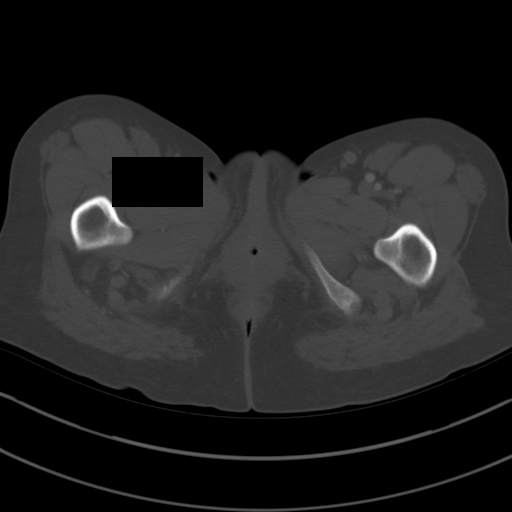
[im 12/92  soft-tissue]
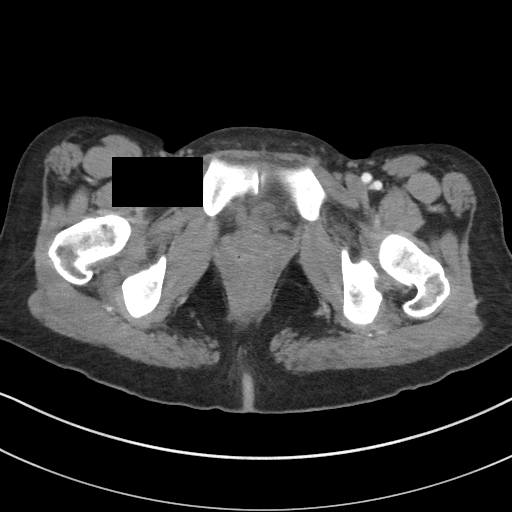
[im 19/92  soft-tissue]
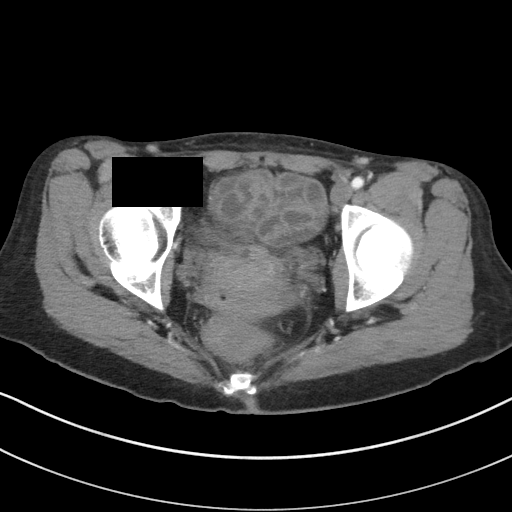
[im 27/92  soft-tissue]
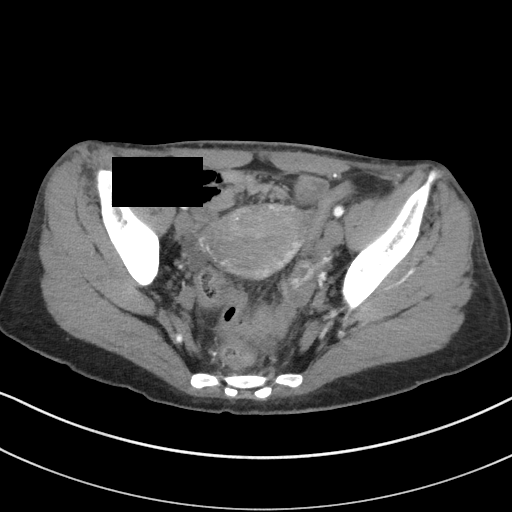
[im 35/92  soft-tissue]
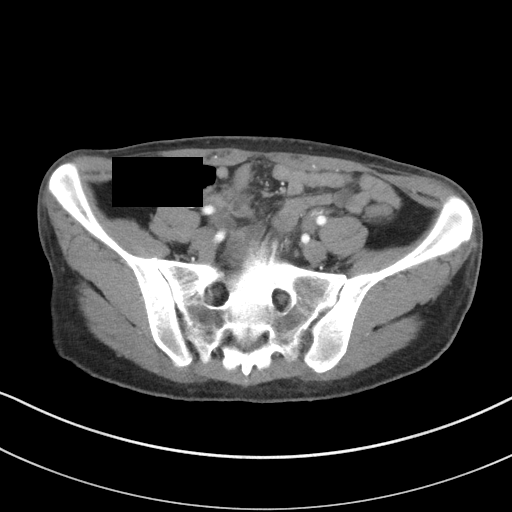
[im 42/92  soft-tissue]
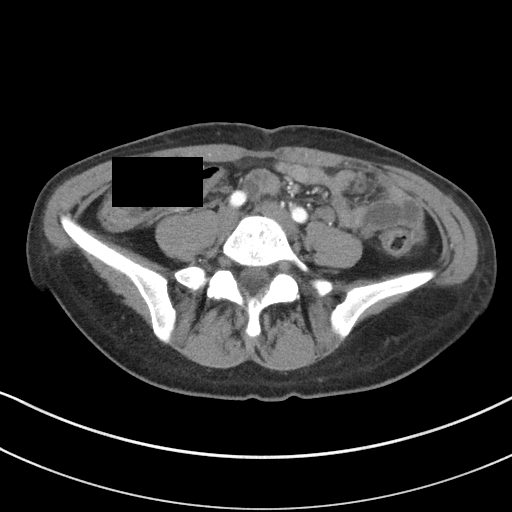
[im 50/92  soft-tissue]
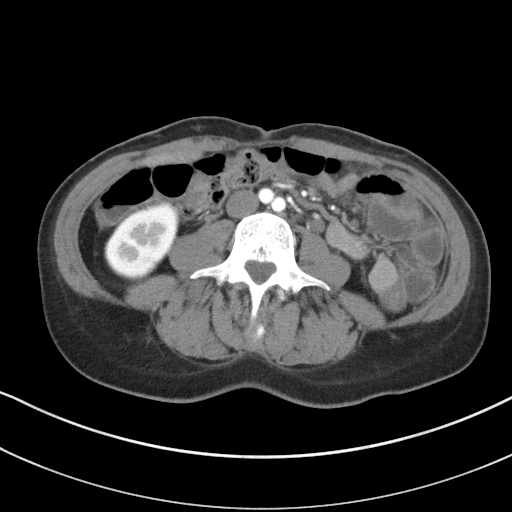
[im 57/92  soft-tissue]
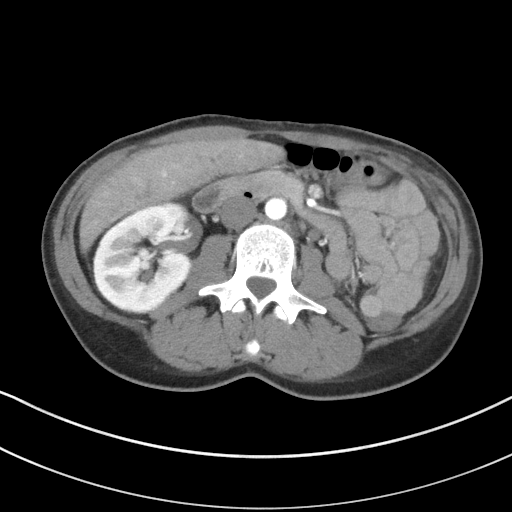
[im 65/92  soft-tissue]
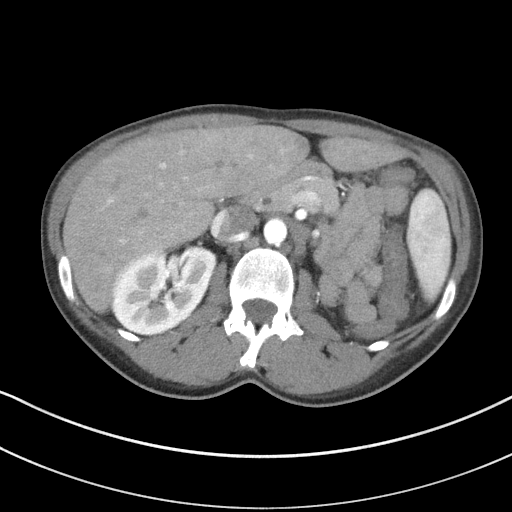
[im 65/92  bone]
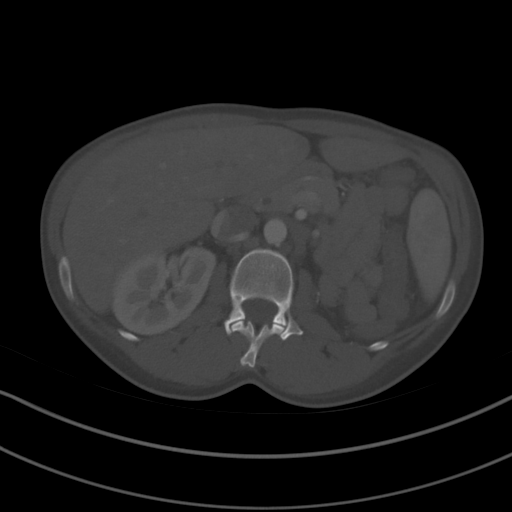
[im 73/92  soft-tissue]
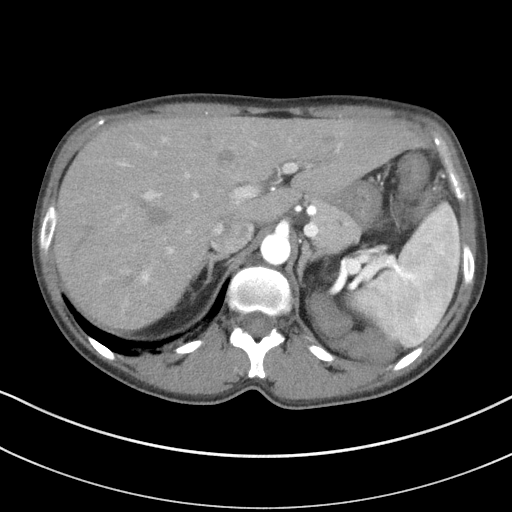
[im 80/92  soft-tissue]
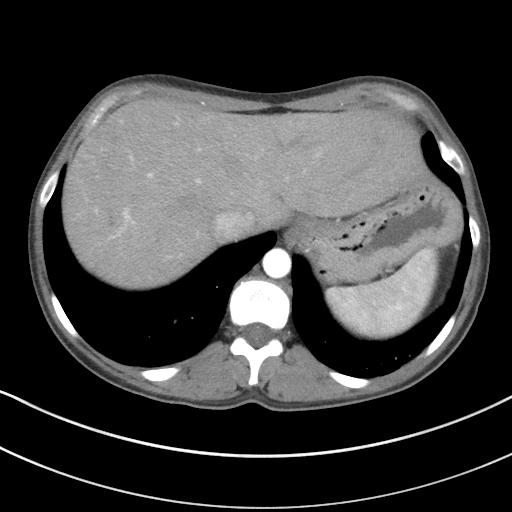
[im 88/92  soft-tissue]
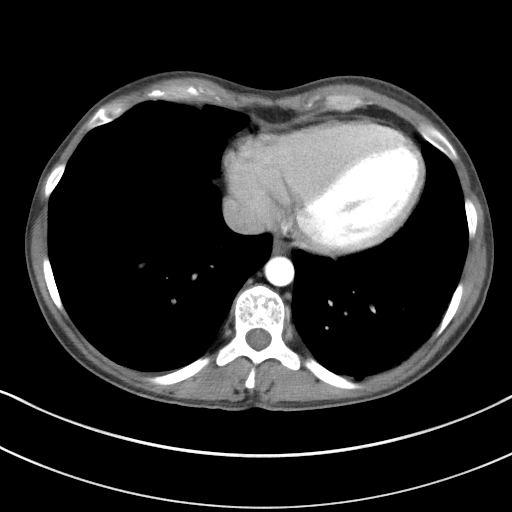

[Series 5: coronal st · coronal · 0.65mm/px · 3 of 67 slices shown]
[im 23/67  soft-tissue]
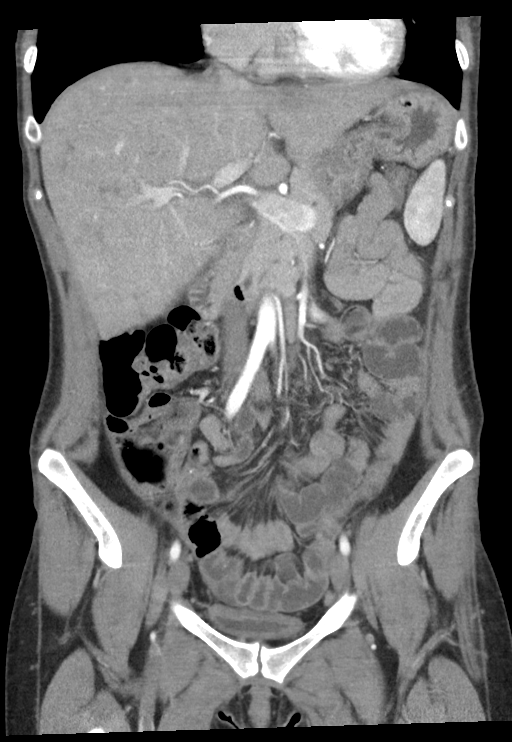
[im 30/67  soft-tissue]
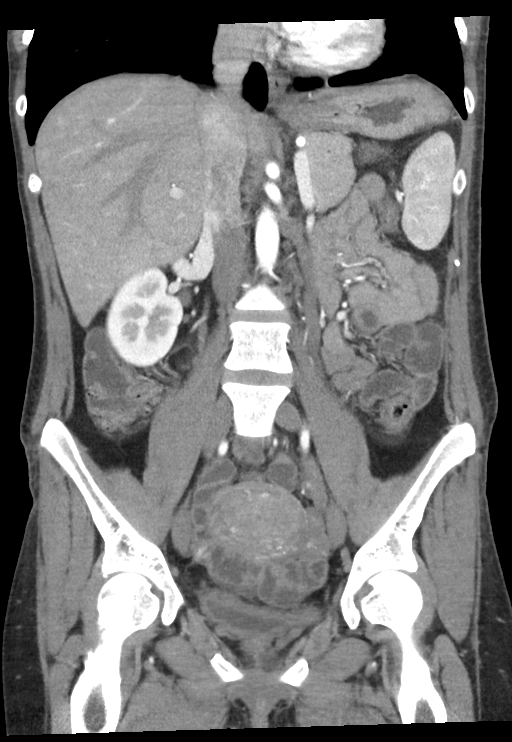
[im 37/67  soft-tissue]
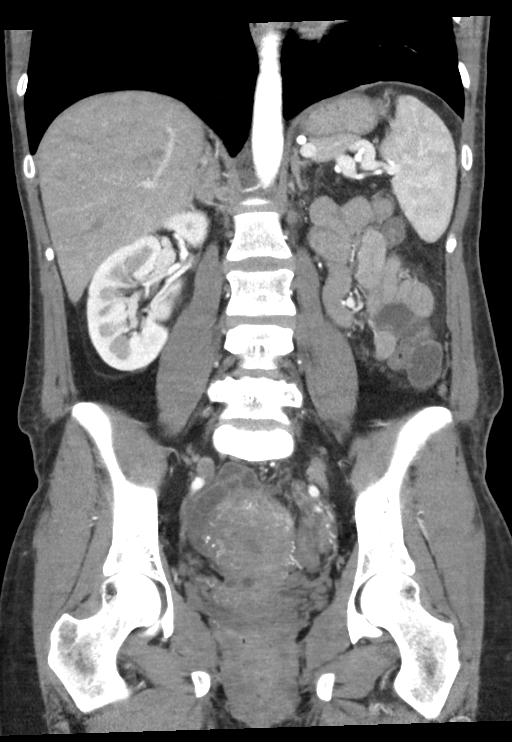

[15 of 46 positions shown; findings below may reference images not displayed]

FINDINGS: Lower Chest: No acute findings.

Hepatobiliary: 1.3 cm low-attenuation lesion in segment 2 of the
left lobe has nonspecific features but remains stable since prior
exam. Previously seen small hypervascular lesion in the anterior
right hepatic lobe is not visualized on today's study, and may be
due to differences in contrast bolus timing. No new or enlarging
liver lesions are identified. Prior cholecystectomy. No evidence of
biliary obstruction.

Pancreas:  No mass or inflammatory changes.

Spleen: Within normal limits in size and appearance.

Adrenals/Urinary Tract: Adrenal glands are normal in appearance.
Right kidney also appears normal. Absent left kidney again noted,
consistent with history of prior left nephrectomy. No evidence of
ureteral calculi or dilatation. Unremarkable unopacified urinary
bladder.

Stomach/Bowel: No evidence of obstruction, inflammatory process or
abnormal fluid collections.

Vascular/Lymphatic: No pathologically enlarged lymph nodes. No
abdominal aortic aneurysm.

Reproductive:  No mass or other significant abnormality.

Other:  None.

Musculoskeletal:  No suspicious bone lesions identified.
IMPRESSION: No acute findings.

Stable indeterminate low-attenuation lesion in left hepatic lobe.
Previously seen hypervascular lesion in right lobe is not visualized
on today's exam, which may be due to differences in contrast bolus
timing. Recommend further characterization with nonemergent
outpatient abdomen MRI without and with contrast as the preferred
exam. (Abdomen CT without and with contrast would be appropriate if
patient has contraindication to MRI or cannot cooperate with
breath-holding.)

## 2019-09-03 ENCOUNTER — Other Ambulatory Visit: Payer: Self-pay

## 2019-09-03 ENCOUNTER — Emergency Department
Admission: EM | Admit: 2019-09-03 | Discharge: 2019-09-03 | Disposition: A | Discharge status: Home / Self Care | Payer: Medicaid Other | Attending: Emergency Medicine | Admitting: Emergency Medicine

## 2019-09-03 ENCOUNTER — Encounter: Payer: Self-pay | Admitting: Radiology

## 2019-09-03 ENCOUNTER — Emergency Department: Payer: Medicaid Other

## 2019-09-03 DIAGNOSIS — R1084 Generalized abdominal pain: Secondary | ICD-10-CM

## 2019-09-03 DIAGNOSIS — F17210 Nicotine dependence, cigarettes, uncomplicated: Secondary | ICD-10-CM | POA: Insufficient documentation

## 2019-09-03 DIAGNOSIS — R109 Unspecified abdominal pain: Secondary | ICD-10-CM | POA: Diagnosis not present

## 2019-09-03 DIAGNOSIS — Z79899 Other long term (current) drug therapy: Secondary | ICD-10-CM | POA: Diagnosis not present

## 2019-09-03 DIAGNOSIS — R197 Diarrhea, unspecified: Secondary | ICD-10-CM | POA: Diagnosis not present

## 2019-09-03 LAB — URINALYSIS, COMPLETE (UACMP) WITH MICROSCOPIC
Bacteria, UA: NONE SEEN
Bilirubin Urine: NEGATIVE
Glucose, UA: NEGATIVE mg/dL
Hgb urine dipstick: NEGATIVE
Ketones, ur: NEGATIVE mg/dL
Leukocytes,Ua: NEGATIVE
Nitrite: NEGATIVE
Protein, ur: NEGATIVE mg/dL
Specific Gravity, Urine: 1.006 (ref 1.005–1.030)
Squamous Epithelial / HPF: NONE SEEN (ref 0–5)
pH: 8 (ref 5.0–8.0)

## 2019-09-03 LAB — CBC WITH DIFFERENTIAL/PLATELET
Abs Immature Granulocytes: 0.01 10*3/uL (ref 0.00–0.07)
Basophils Absolute: 0.1 10*3/uL (ref 0.0–0.1)
Basophils Relative: 1 %
Eosinophils Absolute: 1.2 10*3/uL — ABNORMAL HIGH (ref 0.0–0.5)
Eosinophils Relative: 18 %
HCT: 37.1 % (ref 36.0–46.0)
Hemoglobin: 11.9 g/dL — ABNORMAL LOW (ref 12.0–15.0)
Immature Granulocytes: 0 %
Lymphocytes Relative: 40 %
Lymphs Abs: 2.7 10*3/uL (ref 0.7–4.0)
MCH: 27.1 pg (ref 26.0–34.0)
MCHC: 32.1 g/dL (ref 30.0–36.0)
MCV: 84.5 fL (ref 80.0–100.0)
Monocytes Absolute: 0.5 10*3/uL (ref 0.1–1.0)
Monocytes Relative: 8 %
Neutro Abs: 2.2 10*3/uL (ref 1.7–7.7)
Neutrophils Relative %: 33 %
Platelets: 313 10*3/uL (ref 150–400)
RBC: 4.39 MIL/uL (ref 3.87–5.11)
RDW: 18 % — ABNORMAL HIGH (ref 11.5–15.5)
WBC: 6.7 10*3/uL (ref 4.0–10.5)
nRBC: 0.3 % — ABNORMAL HIGH (ref 0.0–0.2)

## 2019-09-03 LAB — PREGNANCY, URINE: Preg Test, Ur: NEGATIVE

## 2019-09-03 LAB — COMPREHENSIVE METABOLIC PANEL
ALT: 19 U/L (ref 0–44)
AST: 19 U/L (ref 15–41)
Albumin: 4.5 g/dL (ref 3.5–5.0)
Alkaline Phosphatase: 46 U/L (ref 38–126)
Anion gap: 7 (ref 5–15)
BUN: 14 mg/dL (ref 6–20)
CO2: 26 mmol/L (ref 22–32)
Calcium: 9.1 mg/dL (ref 8.9–10.3)
Chloride: 107 mmol/L (ref 98–111)
Creatinine, Ser: 0.8 mg/dL (ref 0.44–1.00)
GFR calc Af Amer: >60 mL/min (ref >60)
GFR calc non Af Amer: >60 mL/min (ref >60)
Glucose, Bld: 85 mg/dL (ref 70–99)
Potassium: 3.5 mmol/L (ref 3.5–5.1)
Sodium: 140 mmol/L (ref 135–145)
Total Bilirubin: 0.8 mg/dL (ref 0.3–1.2)
Total Protein: 8.3 g/dL — ABNORMAL HIGH (ref 6.5–8.1)

## 2019-09-03 LAB — LACTIC ACID, PLASMA: Lactic Acid, Venous: 0.6 mmol/L (ref 0.5–1.9)

## 2019-09-03 MED ORDER — DICYCLOMINE HCL 10 MG/ML IM SOLN
20.0000 mg | Freq: Once | INTRAMUSCULAR | Status: AC
  Administered 2019-09-03: 20 mg via INTRAMUSCULAR
  Filled 2019-09-03: qty 2

## 2019-09-03 MED ORDER — ONDANSETRON HCL 4 MG/2ML IJ SOLN
4.0000 mg | Freq: Once | INTRAMUSCULAR | Status: AC
  Administered 2019-09-03: 4 mg via INTRAVENOUS
  Filled 2019-09-03: qty 2

## 2019-09-03 MED ORDER — MORPHINE SULFATE (PF) 4 MG/ML IV SOLN
4.0000 mg | Freq: Once | INTRAVENOUS | Status: AC
  Administered 2019-09-03: 4 mg via INTRAVENOUS
  Filled 2019-09-03: qty 1

## 2019-09-03 MED ORDER — DICYCLOMINE HCL 10 MG PO CAPS: 10.0000 mg | ORAL_CAPSULE | Freq: Four times a day (QID) | ORAL | 0 refills | Status: AC

## 2019-09-03 MED ORDER — HYDROCODONE-ACETAMINOPHEN 5-325 MG PO TABS: 1.0000 | ORAL_TABLET | Freq: Four times a day (QID) | ORAL | 0 refills | Status: AC | PRN

## 2019-09-03 MED ORDER — IOHEXOL 300 MG/ML  SOLN
100.0000 mL | Freq: Once | INTRAMUSCULAR | Status: AC | PRN
  Administered 2019-09-03: 100 mL via INTRAVENOUS
  Filled 2019-09-03: qty 100

## 2019-09-03 MED ORDER — OXYCODONE-ACETAMINOPHEN 5-325 MG PO TABS
1.0000 | ORAL_TABLET | Freq: Once | ORAL | Status: AC
  Administered 2019-09-03: 1 via ORAL
  Filled 2019-09-03: qty 1

## 2019-09-03 NOTE — ED Notes (Signed)
Pt updated in WR, VS reassessed °

## 2019-09-03 NOTE — ED Triage Notes (Addendum)
Pt arrives via POV from home for reports of intermittent lower abdominal pain since Sunday. Reports pain feels like a stabbing sharp pain. Pt reports she had diarrhea Sunday and Monday but has not since then. Denies nausea and vomiting. Denies dysuria or hematuria, denies fever. Pt in NAD, skin warm and dry.   Pt states she had a c-section 02/16 and she also has an abdominal hernia that she feels as if it is sticking out more than normal.

## 2019-09-03 NOTE — ED Provider Notes (Signed)
[facility] Emergency Department Provider Note  ____________________________________________  Time seen: Approximately 4:22 PM  I have reviewed the triage vital signs and the nursing notes.   HISTORY  Chief Complaint Abdominal Pain    HPI Kim Kidd is a 34 y.o. female who presents to the emergency department for treatment and evaluation and evaluation of intermittent abdominal pain for the past 3 days.  Pain is stabbing and sharp.  She had diarrhea Sunday Monday but none since then.  No nausea or vomiting.  Denies hematuria dysuria.  No known fever. She states that she had an umbilical hernia and feels like it is more swollen than usual. She is able to pass gas and has had a normal bowel movement today.  Past Medical History:  Diagnosis Date   History of blood transfusion    History of preterm labor    Liver mass    Ovarian cyst     Patient Active Problem List   Diagnosis Date Noted   Postoperative state 05/02/2017   Uterine contractions during pregnancy 05/01/2017   Premature uterine contractions 04/13/2017   False labor after 37 weeks of gestation without delivery 04/13/2017   Abdominal pain 03/31/2017   Insufficient prenatal care 03/07/2016   Marijuana use 03/07/2016   Postpartum care following vaginal delivery 03/07/2016    Past Surgical History:  Procedure Laterality Date   APPENDECTOMY     BLADDER REPAIR N/A 05/02/2017   Procedure: BLADDER REPAIR;  Surgeon: Schermerhorn, Ihor Austin, MD;  Location: [facility] ORS;  Service: Obstetrics;  Laterality: N/A;   CESAREAN SECTION N/A 05/02/2017   Procedure: CESAREAN SECTION;  Surgeon: Feliberto Gottron, Ihor Austin, MD;  Location: [facility] ORS;  Service: Obstetrics;  Laterality: N/A;   CHOLECYSTECTOMY     NEPHRECTOMY Left     Prior to Admission medications   Medication Sig Start Date End Date Taking? Authorizing Provider  azithromycin (ZITHROMAX Z-PAK) 250 MG tablet Take 2 tablets (500 mg) on   Day 1,  followed by 1 tablet (250 mg) once daily on Days 2 through 5. 01/19/19   Enid Derry, PA-C  dicyclomine (BENTYL) 10 MG capsule Take 1 capsule (10 mg total) by mouth 4 (four) times daily for 14 days. 09/03/19 09/17/19  Kielyn Kardell, Rulon Eisenmenger B, FNP  HYDROcodone-acetaminophen (NORCO/VICODIN) 5-325 MG tablet Take 1 tablet by mouth every 6 (six) hours as needed for up to 3 days for severe pain. 09/03/19 09/06/19  Khushi Zupko, Kasandra Knudsen, FNP  ibuprofen (ADVIL,MOTRIN) 600 MG tablet Take 1 tablet (600 mg total) by mouth every 6 (six) hours. Patient not taking: Reported on 07/02/2018 11/26/17   Joni Reining, PA-C  lidocaine (LIDODERM) 5 % Place 1 patch onto the skin every 12 (twelve) hours. Remove & Discard patch within 12 hours or as directed by MD 12/15/18 12/15/19  Menshew, Charlesetta Ivory, PA-C  lidocaine (XYLOCAINE) 2 % solution Use as directed 10 mLs in the mouth or throat as needed. Swish and spit 01/19/19   Enid Derry, PA-C  metroNIDAZOLE (FLAGYL) 500 MG tablet Take 1 tablet (500 mg total) by mouth 2 (two) times daily. 07/02/18   Cuthriell, Delorise Royals, PA-C  predniSONE (STERAPRED UNI-PAK 21 TAB) 10 MG (21) TBPK tablet 12 day double strength sterapred taper Patient not taking: Reported on 07/02/2018 12/23/17   Myrna Blazer, MD  triamcinolone ointment (KENALOG) 0.1 % Apply 1 application topically 2 (two) times daily. Patient not taking: Reported on 07/02/2018 09/07/17   Menshew, Charlesetta Ivory, PA-C    Allergies Penicillins  Family History  Problem Relation Age of Onset   Hypertension Maternal Grandmother    Cancer Paternal Grandmother    Cancer Paternal Grandfather     Social History Social History   Tobacco Use   Smoking status: Current Some Day Smoker    Packs/day: 0.50    Years: 15.00    Pack years: 7.50    Types: Cigarettes   Smokeless tobacco: Never Used  Scientific laboratory technician Use: Never used  Substance Use Topics   Alcohol use: No   Drug use: No    Review of  Systems Constitutional: Negative for fever. Respiratory: Negative for shortness of breath or cough. Gastrointestinal: Positive for abdominal pain; negative for nausea , negative for vomiting. Genitourinary: Negative for dysuria , negative for vaginal discharge. Musculoskeletal: negative for back pain. Skin: Negative for acute skin changes/rash/lesion. ____________________________________________   PHYSICAL EXAM:  VITAL SIGNS: ED Triage Vitals  Enc Vitals Group     BP 09/03/19 1201 129/84     Pulse Rate 09/03/19 1201 67     Resp 09/03/19 1201 18     Temp 09/03/19 1201 98.6 F (37 C)     Temp Source 09/03/19 1201 Oral     SpO2 09/03/19 1201 100 %     Weight 09/03/19 1202 160 lb (72.6 kg)     Height 09/03/19 1202 5\' 7"  (1.702 m)     Head Circumference --      Peak Flow --      Pain Score 09/03/19 1201 7     Pain Loc --      Pain Edu? --      Excl. in [place]? --     Constitutional: Alert and oriented. Well appearing and in no acute distress. Eyes: Conjunctivae are normal. Head: Atraumatic. Nose: No congestion/rhinnorhea. Mouth/Throat: Mucous membranes are moist. Respiratory: Normal respiratory effort.  No retractions. Gastrointestinal: Bowel sounds active x 4; Abdomen is soft without rebound or guarding. Genitourinary: Pelvic exam: not indicated. Musculoskeletal: No extremity tenderness nor edema.  Neurologic:  Normal speech and language. No gross focal neurologic deficits are appreciated. Speech is normal. No gait instability. Skin:  Skin is warm, dry and intact. No rash noted on exposed skin. Psychiatric: Mood and affect are normal. Speech and behavior are normal.  ____________________________________________   LABS (all labs ordered are listed, but only abnormal results are displayed)  Labs Reviewed  CBC WITH DIFFERENTIAL/PLATELET - Abnormal; Notable for the following components:      Result Value   Hemoglobin 11.9 (*)    RDW 18.0 (*)    nRBC 0.3 (*)    Eosinophils  Absolute 1.2 (*)    All other components within normal limits  COMPREHENSIVE METABOLIC PANEL - Abnormal; Notable for the following components:   Total Protein 8.3 (*)    All other components within normal limits  URINALYSIS, COMPLETE (UACMP) WITH MICROSCOPIC - Abnormal; Notable for the following components:   Color, Urine COLORLESS (*)    APPearance CLEAR (*)    All other components within normal limits  LACTIC ACID, PLASMA  PREGNANCY, URINE  LACTIC ACID, PLASMA   ____________________________________________  RADIOLOGY  CT of the abdomen pelvis shows No acute intra-abdominal or intrapelvic process.  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 ____________________________________________  Procedures  ____________________________________________  34 year old female presenting to the emergency department for treatment and evaluation of abdominal pain.  See HPI for further details.  Plan will be to get a  CT of her abdomen to make sure that she does not have an incarcerated hernia.  She did have a fever and she is not vomiting.  There is no obvious hernia on exam.  CT does not show any acute findings.  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  Patient was notified of this finding and advised to follow-up with primary care.  Plan will be to discharge her home after labs, urinalysis, and imaging is all reassuring.  She is to call and schedule follow-up appointment with GI.  She is to see primary care or return to the emergency department if she is unable to schedule an appointment with GI if she experiences any symptom of concern.  INITIAL IMPRESSION / ASSESSMENT AND PLAN / ED COURSE  Pertinent labs & imaging results that were available during my care of the patient were reviewed by me and considered in my medical decision making (see chart for  details).  ____________________________________________   FINAL CLINICAL IMPRESSION(S) / ED DIAGNOSES  Final diagnoses:  Generalized abdominal pain    Note:  This document was prepared using Dragon voice recognition software and may include unintentional dictation errors.   Chinita Pester, FNP 09/03/19 2126    Phineas Semen, MD 09/03/19 2153

## 2019-09-03 NOTE — ED Notes (Signed)
Pt states having lower abdominal pain that radiates to under her breast. Pt denies n/v but does have diarrhea. Denies fevers.

## 2019-09-03 NOTE — Discharge Instructions (Signed)
Please call and schedule a follow up with GI. Return to the ER if your symptoms change, worsen, or for new concerns if unable to see primary care or GI.

## 2019-09-18 ENCOUNTER — Emergency Department
Admission: EM | Admit: 2019-09-18 | Discharge: 2019-09-18 | Disposition: A | Discharge status: Home / Self Care | Payer: Medicaid Other | Attending: Emergency Medicine | Admitting: Emergency Medicine

## 2019-09-18 ENCOUNTER — Other Ambulatory Visit: Payer: Self-pay

## 2019-09-18 ENCOUNTER — Encounter: Payer: Self-pay | Admitting: Emergency Medicine

## 2019-09-18 DIAGNOSIS — F17210 Nicotine dependence, cigarettes, uncomplicated: Secondary | ICD-10-CM | POA: Insufficient documentation

## 2019-09-18 DIAGNOSIS — K029 Dental caries, unspecified: Secondary | ICD-10-CM | POA: Diagnosis not present

## 2019-09-18 DIAGNOSIS — K0889 Other specified disorders of teeth and supporting structures: Secondary | ICD-10-CM | POA: Diagnosis not present

## 2019-09-18 MED ORDER — HYDROCODONE-ACETAMINOPHEN 5-325 MG PO TABS: 1.0000 | ORAL_TABLET | Freq: Four times a day (QID) | ORAL | 0 refills | Status: AC | PRN

## 2019-09-18 MED ORDER — LIDOCAINE VISCOUS HCL 2 % MT SOLN: OROMUCOSAL | 0 refills | Status: AC

## 2019-09-18 MED ORDER — HYDROCODONE-ACETAMINOPHEN 5-325 MG PO TABS
1.0000 | ORAL_TABLET | Freq: Once | ORAL | Status: AC
  Administered 2019-09-18: 1 via ORAL
  Filled 2019-09-18: qty 1

## 2019-09-18 MED ORDER — CLINDAMYCIN HCL 150 MG PO CAPS: ORAL_CAPSULE | ORAL | 0 refills | Status: AC

## 2019-09-18 NOTE — ED Triage Notes (Signed)
Pt reports toothache to left lower back jaw for 2 days. Pt states that she was seen by her dentist yesterday who told her to make an appt with an oral surgeon. Pt states her appt is not until 7/20 and she is in pain.

## 2019-09-18 NOTE — ED Notes (Signed)
See triage note  Presents with dental pain for the past 3 days  States she was seen by her dentist  He placed her some IBU 800 mg  States she is still in pain    She is scheduled to see oral surgeon in 3 weeks

## 2019-09-18 NOTE — ED Provider Notes (Signed)
[facility] Emergency Department Provider Note  ____________________________________________   First MD Initiated Contact with Patient 09/18/19 [phone]     (approximate)  I have reviewed the triage vital signs and the nursing notes.   HISTORY  Chief Complaint Dental Pain   HPI Kim Kidd is a 34 y.o. female presents to the ED with complaint of dental pain for the last 3 days.  She states that she saw her dentist yesterday and was placed on ibuprofen 800 mg which she states is not helping with her pain.  She is scheduled to see an oral surgeon in 3 weeks.  Patient reports that she does not have any antibiotics.  She continues to smoke cigarettes which increases her pain.  She rates her pain as 9 out of 10.       Past Medical History:  Diagnosis Date  . History of blood transfusion   . History of preterm labor   . Liver mass   . Ovarian cyst     Patient Active Problem List   Diagnosis Date Noted  . Postoperative state 05/02/2017  . Uterine contractions during pregnancy 05/01/2017  . Premature uterine contractions 04/13/2017  . False labor after 37 weeks of gestation without delivery 04/13/2017  . Abdominal pain 03/31/2017  . Insufficient prenatal care 03/07/2016  . 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 Postpartum care following vaginal delivery 03/07/2016    Past Surgical History:  Procedure Laterality Date  . APPENDECTOMY    . BLADDER REPAIR N/A 05/02/2017   Procedure: BLADDER REPAIR;  Surgeon: Schermerhorn, Ihor Austin, MD;  Location: [facility] ORS;  Service: Obstetrics;  Laterality: N/A;  . CESAREAN SECTION N/A 05/02/2017   Procedure: CESAREAN SECTION;  Surgeon: Feliberto Gottron Ihor Austin, MD;  Location: [facility] ORS;  Service: Obstetrics;  Laterality: N/A;  . CHOLECYSTECTOMY    . NEPHRECTOMY Left     Prior to Admission medications   Medication Sig Start Date End Date Taking? Authorizing Provider  clindamycin (CLEOCIN) 150 MG capsule 2 caps tid until finished  09/18/19   Bridget Hartshorn L, PA-C  dicyclomine (BENTYL) 10 MG capsule Take 1 capsule (10 mg total) by mouth 4 (four) times daily for 14 days. 09/03/19 09/17/19  Triplett, Rulon Eisenmenger B, FNP  HYDROcodone-acetaminophen (NORCO/VICODIN) 5-325 MG tablet Take 1 tablet by mouth every 6 (six) hours as needed for moderate pain. 09/18/19   Tommi Rumps, PA-C  lidocaine (XYLOCAINE) 2 % solution Place on cotton ball and apply to tooth to help with pain 09/18/19   Tommi Rumps, PA-C    Allergies Penicillins  Family History  Problem Relation Age of Onset  . Hypertension Maternal Grandmother   . Cancer Paternal Grandmother   . Cancer Paternal Grandfather     Social History Social History   Tobacco Use  . Smoking status: Current Some Day Smoker    Packs/day: 0.50    Years: 15.00    Pack years: 7.50    Types: Cigarettes  . Smokeless tobacco: Never Used  Vaping Use  . Vaping Use: Never used  Substance Use Topics  . Alcohol use: No  . Drug use: No    Review of Systems Constitutional: No fever/chills Eyes: No visual changes. ENT: No sore throat.  Positive dental pain. Cardiovascular: Denies chest pain. Respiratory: Denies shortness of breath. Gastrointestinal: No abdominal pain.  No nausea, no vomiting.  Genitourinary: Negative for dysuria. Musculoskeletal: Negative for back pain. Skin: Negative for rash. Neurological: Negative for headaches, focal weakness or numbness. ____________________________________________  PHYSICAL EXAM:  VITAL SIGNS: ED Triage Vitals [09/18/19 0928]  Enc Vitals Group     BP 111/72     Pulse Rate 78     Resp 16     Temp 98 F (36.7 C)     Temp Source Oral     SpO2 100 %     Weight 180 lb (81.6 kg)     Height 5\' 7"  (1.702 m)     Head Circumference      Peak Flow      Pain Score 9     Pain Loc      Pain Edu?      Excl. in GC?     Constitutional: Alert and oriented. Well appearing and in no acute distress. Eyes: Conjunctivae are normal. PERRL.  EOMI. Head: Atraumatic. Nose: No congestion/rhinnorhea. Mouth/Throat: Mucous membranes are moist.  Oropharynx non-erythematous.  Left lower premolar in poor repair.  There is a cavity at the gumline.  No active drainage is noted.  Area is tender to touch. Neck: No stridor.   Cardiovascular: Normal rate, regular rhythm. Grossly normal heart sounds.  Good peripheral circulation. Respiratory: Normal respiratory effort.  No retractions. Lungs CTAB. Musculoskeletal: Ambulatory without any assistance. Neurologic:  Normal speech and language. No gross focal neurologic deficits are appreciated. No gait instability. Skin:  Skin is warm, dry and intact. Psychiatric: Mood and affect are normal. Speech and behavior are normal.  ____________________________________________   LABS (all labs ordered are listed, but only abnormal results are displayed)  Labs Reviewed - No data to display  PROCEDURES  Procedure(s) performed (including Critical Care):  Procedures   ____________________________________________   INITIAL IMPRESSION / ASSESSMENT AND PLAN / ED COURSE  As part of my medical decision making, I reviewed the following data within the electronic MEDICAL RECORD NUMBER Notes from prior ED visits and [place] Controlled Substance Database  34 year old female presents to the ED with complaint of dental pain for the last 3 days.  She states she saw her dentist today and is being referred to an oral 31.  She states her appointment is 10/07/2019.  She is upset and that she did not get an antibiotic.  She has tried over-the-counter medications without any relief.  A prescription for clindamycin was sent to her pharmacy to take for the next 7 days.  Viscous lidocaine 2% to apply to a cottonball and apply directly to the tooth to help with pain.  A prescription for Norco was sent to the pharmacy with the understanding that she should try anti-inflammatories before taking  this.  ____________________________________________   FINAL CLINICAL IMPRESSION(S) / ED DIAGNOSES  Final diagnoses:  Pain due to dental caries     ED Discharge Orders         Ordered    clindamycin (CLEOCIN) 150 MG capsule     Discontinue  Reprint     09/18/19 1029    HYDROcodone-acetaminophen (NORCO/VICODIN) 5-325 MG tablet  Every 6 hours PRN     Discontinue  Reprint     09/18/19 1029    lidocaine (XYLOCAINE) 2 % solution     Discontinue  Reprint     09/18/19 1029           Note:  This document was prepared using Dragon voice recognition software and may include unintentional dictation errors.    11/19/19, PA-C 09/18/19 1528    11/19/19, MD 09/19/19 [phone]

## 2019-09-18 NOTE — Discharge Instructions (Signed)
Follow-up with your dentist as needed and keep your appointment with your oral surgeon.  Continue with the ibuprofen and begin taking the clindamycin until completely finished.  Also for breakthrough pain you may take the Norco.  A prescription for viscous lidocaine was also sent to place on a cottonball and onto your tooth which will help numb it up.

## 2021-08-13 ENCOUNTER — Other Ambulatory Visit: Payer: Self-pay

## 2021-08-13 ENCOUNTER — Encounter: Payer: Self-pay | Admitting: Emergency Medicine

## 2021-08-13 ENCOUNTER — Emergency Department: Payer: Medicaid Other

## 2021-08-13 ENCOUNTER — Emergency Department
Admission: EM | Admit: 2021-08-13 | Discharge: 2021-08-13 | Disposition: A | Discharge status: Home / Self Care | Payer: Medicaid Other | Attending: Emergency Medicine | Admitting: Emergency Medicine

## 2021-08-13 DIAGNOSIS — K047 Periapical abscess without sinus: Secondary | ICD-10-CM | POA: Insufficient documentation

## 2021-08-13 DIAGNOSIS — R220 Localized swelling, mass and lump, head: Secondary | ICD-10-CM | POA: Diagnosis not present

## 2021-08-13 LAB — BASIC METABOLIC PANEL
Anion gap: 6 (ref 5–15)
BUN: 11 mg/dL (ref 6–20)
CO2: 27 mmol/L (ref 22–32)
Calcium: 8.5 mg/dL — ABNORMAL LOW (ref 8.9–10.3)
Chloride: 104 mmol/L (ref 98–111)
Creatinine, Ser: 0.85 mg/dL (ref 0.44–1.00)
GFR, Estimated: >60 mL/min (ref >60)
Glucose, Bld: 111 mg/dL — ABNORMAL HIGH (ref 70–99)
Potassium: 4.3 mmol/L (ref 3.5–5.1)
Sodium: 137 mmol/L (ref 135–145)

## 2021-08-13 LAB — CBC WITH DIFFERENTIAL/PLATELET
Abs Immature Granulocytes: 0.01 10*3/uL (ref 0.00–0.07)
Basophils Absolute: 0.1 10*3/uL (ref 0.0–0.1)
Basophils Relative: 1 %
Eosinophils Absolute: 0.4 10*3/uL (ref 0.0–0.5)
Eosinophils Relative: 5 %
HCT: 36.4 % (ref 36.0–46.0)
Hemoglobin: 11.6 g/dL — ABNORMAL LOW (ref 12.0–15.0)
Immature Granulocytes: 0 %
Lymphocytes Relative: 23 %
Lymphs Abs: 1.7 10*3/uL (ref 0.7–4.0)
MCH: 30.3 pg (ref 26.0–34.0)
MCHC: 31.9 g/dL (ref 30.0–36.0)
MCV: 95 fL (ref 80.0–100.0)
Monocytes Absolute: 0.9 10*3/uL (ref 0.1–1.0)
Monocytes Relative: 13 %
Neutro Abs: 4.2 10*3/uL (ref 1.7–7.7)
Neutrophils Relative %: 58 %
Platelets: 289 10*3/uL (ref 150–400)
RBC: 3.83 MIL/uL — ABNORMAL LOW (ref 3.87–5.11)
RDW: 13.4 % (ref 11.5–15.5)
WBC: 7.3 10*3/uL (ref 4.0–10.5)
nRBC: 0 % (ref 0.0–0.2)

## 2021-08-13 LAB — POC URINE PREG, ED: Preg Test, Ur: NEGATIVE

## 2021-08-13 MED ORDER — SODIUM CHLORIDE 0.9 % IV SOLN
3.0000 g | Freq: Once | INTRAVENOUS | Status: AC
  Administered 2021-08-13: 3 g via INTRAVENOUS
  Filled 2021-08-13: qty 8

## 2021-08-13 MED ORDER — KETOROLAC TROMETHAMINE 15 MG/ML IJ SOLN
15.0000 mg | Freq: Once | INTRAMUSCULAR | Status: AC
  Administered 2021-08-13: 15 mg via INTRAVENOUS
  Filled 2021-08-13: qty 1

## 2021-08-13 MED ORDER — AMOXICILLIN-POT CLAVULANATE 875-125 MG PO TABS: 1.0000 | ORAL_TABLET | Freq: Two times a day (BID) | ORAL | 0 refills | Status: AC

## 2021-08-13 MED ORDER — MORPHINE SULFATE (PF) 4 MG/ML IV SOLN
4.0000 mg | Freq: Once | INTRAVENOUS | Status: AC
  Administered 2021-08-13: 4 mg via INTRAVENOUS
  Filled 2021-08-13: qty 1

## 2021-08-13 MED ORDER — LIDOCAINE-EPINEPHRINE 1 %-1:100000 IJ SOLN
20.0000 mL | Freq: Once | INTRAMUSCULAR | Status: AC
  Administered 2021-08-13: 20 mL
  Filled 2021-08-13: qty 20

## 2021-08-13 MED ORDER — DEXAMETHASONE SODIUM PHOSPHATE 10 MG/ML IJ SOLN
10.0000 mg | Freq: Once | INTRAMUSCULAR | Status: AC
  Administered 2021-08-13: 10 mg via INTRAVENOUS
  Filled 2021-08-13: qty 1

## 2021-08-13 MED ORDER — SODIUM CHLORIDE 0.9 % IV BOLUS
1000.0000 mL | Freq: Once | INTRAVENOUS | Status: AC
  Administered 2021-08-13: 1000 mL via INTRAVENOUS

## 2021-08-13 MED ORDER — OXYCODONE HCL 5 MG PO TABS: 5.0000 mg | ORAL_TABLET | Freq: Three times a day (TID) | ORAL | 0 refills | Status: AC | PRN

## 2021-08-13 MED ORDER — IOHEXOL 300 MG/ML  SOLN
75.0000 mL | Freq: Once | INTRAMUSCULAR | Status: AC | PRN
  Administered 2021-08-13: 75 mL via INTRAVENOUS

## 2021-08-13 NOTE — ED Triage Notes (Signed)
Pt via POV from home. Pt c/o L sided facial swelling, states that she noticed it yesterday but it wasn't that significant. States that swelling changed drastically between now and yesterday. Large amount of swelling noted to the L side of pt's lower jaw. Pt also endorses pain. Unknown if it from a tooth but she does have teeth on that side that needs worse. Denies SOB but states it is painful to swallow, but able to swallow own saliva. Pt is A&Ox4 and NAD

## 2021-08-13 NOTE — ED Provider Notes (Addendum)
[facility] Provider Note    Event Date/Time   First MD Initiated Contact with Patient 08/13/21 [phone]     (approximate)   History   Facial Swelling   HPI  Kim Kidd is a 36 y.o. female   with no significant past medical history presents with facial swelling.  Patient started having some swelling in the left mandibular area yesterday and is significantly increased today.  She endorses pain in the bottom tooth in that area.  No difficulty swallowing no change in voice no fevers chills.       Past Medical History:  Diagnosis Date   History of blood transfusion    History of preterm labor    Liver mass    Ovarian cyst     Patient Active Problem List   Diagnosis Date Noted   Postoperative state 05/02/2017   Uterine contractions during pregnancy 05/01/2017   Premature uterine contractions 04/13/2017   False labor after 37 weeks of gestation without delivery 04/13/2017   Abdominal pain 03/31/2017   Insufficient prenatal care 03/07/2016   Marijuana use 03/07/2016   Postpartum care following vaginal delivery 03/07/2016     Physical Exam  Triage Vital Signs: ED Triage Vitals  Enc Vitals Group     BP 08/13/21 0755 116/81     Pulse Rate 08/13/21 0755 84     Resp 08/13/21 0755 19     Temp 08/13/21 0755 98.1 F (36.7 C)     Temp Source 08/13/21 0755 Oral     SpO2 08/13/21 0755 99 %     Weight 08/13/21 0754 150 lb (68 kg)     Height 08/13/21 0754 5\' 7"  (1.702 m)     Head Circumference --      Peak Flow --      Pain Score 08/13/21 0753 9     Pain Loc --      Pain Edu? --      Excl. in GC? --     Most recent vital signs: Vitals:   08/13/21 1230 08/13/21 1300  BP: 110/72 126/75  Pulse: 75 98  Resp:  (!) 22  Temp:    SpO2: 96% 95%     General: Awake, no distress.  CV:  Good peripheral perfusion.  Resp:  Normal effort.  Abd:  No distention.  Neuro:             Awake, Alert, Oriented x 3  Other:  Significant left  mandibular/submandibular swelling, there is no trismus, there is poor dentition with dental caries of the molars, tenderness to palpation there is fluctuance along the buccal space No swelling or tenderness in the sublingual area or floor of the mouth    ED Results / Procedures / Treatments  Labs (all labs ordered are listed, but only abnormal results are displayed) Labs Reviewed  CBC WITH DIFFERENTIAL/PLATELET - Abnormal; Notable for the following components:      Result Value   RBC 3.83 (*)    Hemoglobin 11.6 (*)    All other components within normal limits  BASIC METABOLIC PANEL - Abnormal; Notable for the following components:   Glucose, Bld 111 (*)    Calcium 8.5 (*)    All other components within normal limits  POC URINE PREG, ED     EKG     RADIOLOGY CT max face reviewed and interpreted myself shows a odontogenic abscess   PROCEDURES:  Critical Care performed: No  ..Incision and Drainage  Date/Time: 08/13/2021 4:45  PM Performed by: Georga Hacking, MD Authorized by: Georga Hacking, MD   Consent:    Consent obtained:  Verbal   Risks discussed:  Incomplete drainage, bleeding and pain Universal protocol:    Patient identity confirmed:  Verbally with patient Location:    Type:  Abscess   Location:  Mouth   Mouth location:  Alveolar process Sedation:    Sedation type:  None Anesthesia:    Anesthesia method:  Local infiltration and nerve block   Local anesthetic:  Lidocaine 2% WITH epi   Block needle gauge:  25 G   Block technique:  Inferior alveloar   Block injection procedure:  Anatomic landmarks identified Procedure type:    Complexity:  Simple Procedure details:    Ultrasound guidance: no     Incision types:  Stab incision   Incision depth:  Submucosal   Drainage:  Purulent   Drainage amount:  Moderate Post-procedure details:    Procedure completion:  Tolerated with difficulty  The patient is on the cardiac monitor to evaluate for  evidence of arrhythmia and/or significant heart rate changes.   MEDICATIONS ORDERED IN ED: Medications  lidocaine-EPINEPHrine (XYLOCAINE W/EPI) 1 %-1:100000 (with pres) injection 20 mL (20 mLs Infiltration Given by Other 08/13/21 0906)  sodium chloride 0.9 % bolus 1,000 mL (0 mLs Intravenous Stopped 08/13/21 0956)  dexamethasone (DECADRON) injection 10 mg (10 mg Intravenous Given 08/13/21 0851)  ketorolac (TORADOL) 15 MG/ML injection 15 mg (15 mg Intravenous Given 08/13/21 0851)  Ampicillin-Sulbactam (UNASYN) 3 g in sodium chloride 0.9 % 100 mL IVPB (0 g Intravenous Stopped 08/13/21 0927)  iohexol (OMNIPAQUE) 300 MG/ML solution 75 mL (75 mLs Intravenous Contrast Given 08/13/21 1123)  morphine (PF) 4 MG/ML injection 4 mg (4 mg Intravenous Given 08/13/21 1142)     IMPRESSION / MDM / ASSESSMENT AND PLAN / ED COURSE  I reviewed the triage vital signs and the nursing notes.                              Differential diagnosis includes, but is not limited to, ental abscess, deep space infection, osteomyelitis of the jaw, Ludwig's angina  Patient is a 36 year old female presents with left facial swelling and tenderness and dental pain.  On exam she clearly has a dental abscess arising from one of the left lower molars with fluctuance in the buccal space.  There is also significant swelling of the left submandibular area but no sublingual swelling or elevation of the floor the mouth to suggest Ludwick's angina and edition it is very unilateral.  Plan to give the first dose of IV antibiotics dexamethasone IV fluids pain control and obtain a CT to rule out deep space infection.  Ultimately suspect she will need an incision and drainage in the ED.  Overall reassuring she has no leukocytosis.  CT does show a abscess near the buccal space there is some mild edema of the floor the mouth but no cellulitis a to suggest Ludwig's angina.  I performed a bedside I&D with return of pus.  I think because I have likely  achieved source control and patient has no evidence of deep space infection or Ludwig's that she is appropriate for outpatient antibiotics.  We will treat with 7 days of Augmentin and I prescribed oxycodone for pain control.  Discussed with the patient if that if symptoms are worsening and she develops increasing swelling despite being on antibiotics that she return.  FINAL CLINICAL IMPRESSION(S) / ED DIAGNOSES   Final diagnoses:  Dental abscess     Rx / DC Orders   ED Discharge Orders          Ordered    amoxicillin-clavulanate (AUGMENTIN) 875-125 MG tablet  2 times daily        08/13/21 1307    oxyCODONE (ROXICODONE) 5 MG immediate release tablet  Every 8 hours PRN        08/13/21 1308             Note:  This document was prepared using Dragon voice recognition software and may include unintentional dictation errors.   Georga HackingMcHugh, Zaliyah Meikle Rose, MD 08/13/21 1644    Georga HackingMcHugh, Teddi Badalamenti Rose, MD 08/13/21 [phone]

## 2021-08-13 NOTE — Discharge Instructions (Addendum)
He had a dental abscess which was drained in the emergency department.  Please take the antibiotic twice a day for the next 7 days.  If your swelling is worsening particularly you are having worsening swelling under your chin please return to the emergency department.  Please follow-up with your dentist as soon as possible.  You can take Tylenol and ibuprofen for the pain in addition to oxycodone as needed for breakthrough pain.
